# Patient Record
Sex: Male | Born: 1965
Health system: Southern US, Community
[De-identification: ages and names within clinical notes are randomized; demographics above are authoritative.]

## PROBLEM LIST (undated history)

## (undated) DIAGNOSIS — E785 Hyperlipidemia, unspecified: Secondary | ICD-10-CM

## (undated) DIAGNOSIS — E119 Type 2 diabetes mellitus without complications: Secondary | ICD-10-CM

## (undated) HISTORY — DX: Hyperlipidemia, unspecified: E78.5

## (undated) HISTORY — DX: Type 2 diabetes mellitus without complications: E11.9

---

## 2001-02-01 ENCOUNTER — Encounter: Admission: RE | Admit: 2001-02-01 | Discharge: 2001-02-01 | Payer: Self-pay | Admitting: Emergency Medicine

## 2001-02-01 ENCOUNTER — Encounter: Payer: Self-pay | Admitting: Emergency Medicine

## 2002-05-03 ENCOUNTER — Encounter: Payer: Self-pay | Admitting: Emergency Medicine

## 2002-05-03 ENCOUNTER — Encounter: Admission: RE | Admit: 2002-05-03 | Discharge: 2002-05-03 | Payer: Self-pay | Admitting: Emergency Medicine

## 2002-11-14 ENCOUNTER — Encounter: Admission: RE | Admit: 2002-11-14 | Discharge: 2002-11-14 | Payer: Self-pay | Admitting: Emergency Medicine

## 2002-11-14 ENCOUNTER — Encounter: Payer: Self-pay | Admitting: Emergency Medicine

## 2003-11-14 ENCOUNTER — Encounter: Admission: RE | Admit: 2003-11-14 | Discharge: 2003-11-14 | Payer: Self-pay | Admitting: Emergency Medicine

## 2017-09-14 DIAGNOSIS — Z0000 Encounter for general adult medical examination without abnormal findings: Secondary | ICD-10-CM | POA: Diagnosis not present

## 2017-09-14 DIAGNOSIS — R7309 Other abnormal glucose: Secondary | ICD-10-CM | POA: Diagnosis not present

## 2017-09-14 DIAGNOSIS — Z125 Encounter for screening for malignant neoplasm of prostate: Secondary | ICD-10-CM | POA: Diagnosis not present

## 2017-09-21 DIAGNOSIS — Z0000 Encounter for general adult medical examination without abnormal findings: Secondary | ICD-10-CM | POA: Diagnosis not present

## 2017-09-21 DIAGNOSIS — Z23 Encounter for immunization: Secondary | ICD-10-CM | POA: Diagnosis not present

## 2017-09-21 DIAGNOSIS — Z1389 Encounter for screening for other disorder: Secondary | ICD-10-CM | POA: Diagnosis not present

## 2017-09-21 DIAGNOSIS — R7309 Other abnormal glucose: Secondary | ICD-10-CM | POA: Diagnosis not present

## 2017-09-21 DIAGNOSIS — M545 Low back pain: Secondary | ICD-10-CM | POA: Diagnosis not present

## 2017-09-21 DIAGNOSIS — K219 Gastro-esophageal reflux disease without esophagitis: Secondary | ICD-10-CM | POA: Diagnosis not present

## 2017-09-21 DIAGNOSIS — J302 Other seasonal allergic rhinitis: Secondary | ICD-10-CM | POA: Diagnosis not present

## 2017-09-21 DIAGNOSIS — E781 Pure hyperglyceridemia: Secondary | ICD-10-CM | POA: Diagnosis not present

## 2017-09-28 DIAGNOSIS — M5416 Radiculopathy, lumbar region: Secondary | ICD-10-CM | POA: Diagnosis not present

## 2017-09-28 DIAGNOSIS — Z1212 Encounter for screening for malignant neoplasm of rectum: Secondary | ICD-10-CM | POA: Diagnosis not present

## 2017-10-06 DIAGNOSIS — M545 Low back pain: Secondary | ICD-10-CM | POA: Diagnosis not present

## 2017-10-20 DIAGNOSIS — M5136 Other intervertebral disc degeneration, lumbar region: Secondary | ICD-10-CM | POA: Diagnosis not present

## 2017-10-20 DIAGNOSIS — M51369 Other intervertebral disc degeneration, lumbar region without mention of lumbar back pain or lower extremity pain: Secondary | ICD-10-CM | POA: Insufficient documentation

## 2017-11-02 DIAGNOSIS — M5416 Radiculopathy, lumbar region: Secondary | ICD-10-CM | POA: Diagnosis not present

## 2018-08-24 DIAGNOSIS — M5136 Other intervertebral disc degeneration, lumbar region: Secondary | ICD-10-CM | POA: Diagnosis not present

## 2018-08-24 DIAGNOSIS — M5126 Other intervertebral disc displacement, lumbar region: Secondary | ICD-10-CM | POA: Diagnosis not present

## 2018-09-09 DIAGNOSIS — M5136 Other intervertebral disc degeneration, lumbar region: Secondary | ICD-10-CM | POA: Diagnosis not present

## 2018-09-20 DIAGNOSIS — Z125 Encounter for screening for malignant neoplasm of prostate: Secondary | ICD-10-CM | POA: Diagnosis not present

## 2018-09-20 DIAGNOSIS — E781 Pure hyperglyceridemia: Secondary | ICD-10-CM | POA: Diagnosis not present

## 2018-09-20 DIAGNOSIS — Z0000 Encounter for general adult medical examination without abnormal findings: Secondary | ICD-10-CM | POA: Diagnosis not present

## 2018-09-20 DIAGNOSIS — R739 Hyperglycemia, unspecified: Secondary | ICD-10-CM | POA: Diagnosis not present

## 2018-09-23 DIAGNOSIS — R82998 Other abnormal findings in urine: Secondary | ICD-10-CM | POA: Diagnosis not present

## 2018-09-27 DIAGNOSIS — R7301 Impaired fasting glucose: Secondary | ICD-10-CM | POA: Diagnosis not present

## 2018-09-27 DIAGNOSIS — K219 Gastro-esophageal reflux disease without esophagitis: Secondary | ICD-10-CM | POA: Diagnosis not present

## 2018-09-27 DIAGNOSIS — Z0000 Encounter for general adult medical examination without abnormal findings: Secondary | ICD-10-CM | POA: Diagnosis not present

## 2018-09-27 DIAGNOSIS — E781 Pure hyperglyceridemia: Secondary | ICD-10-CM | POA: Diagnosis not present

## 2018-09-27 DIAGNOSIS — Z1331 Encounter for screening for depression: Secondary | ICD-10-CM | POA: Diagnosis not present

## 2018-09-27 DIAGNOSIS — J302 Other seasonal allergic rhinitis: Secondary | ICD-10-CM | POA: Diagnosis not present

## 2018-10-11 DIAGNOSIS — M48061 Spinal stenosis, lumbar region without neurogenic claudication: Secondary | ICD-10-CM | POA: Insufficient documentation

## 2018-10-11 DIAGNOSIS — M5126 Other intervertebral disc displacement, lumbar region: Secondary | ICD-10-CM | POA: Diagnosis not present

## 2018-10-11 DIAGNOSIS — M5136 Other intervertebral disc degeneration, lumbar region: Secondary | ICD-10-CM | POA: Diagnosis not present

## 2018-10-19 DIAGNOSIS — M545 Low back pain: Secondary | ICD-10-CM | POA: Diagnosis not present

## 2018-10-27 DIAGNOSIS — M545 Low back pain: Secondary | ICD-10-CM | POA: Diagnosis not present

## 2018-11-02 DIAGNOSIS — M5416 Radiculopathy, lumbar region: Secondary | ICD-10-CM | POA: Diagnosis not present

## 2018-11-02 DIAGNOSIS — M5136 Other intervertebral disc degeneration, lumbar region: Secondary | ICD-10-CM | POA: Diagnosis not present

## 2018-11-25 DIAGNOSIS — M5126 Other intervertebral disc displacement, lumbar region: Secondary | ICD-10-CM | POA: Diagnosis not present

## 2018-12-13 DIAGNOSIS — M5136 Other intervertebral disc degeneration, lumbar region: Secondary | ICD-10-CM | POA: Diagnosis not present

## 2018-12-23 DIAGNOSIS — Z79899 Other long term (current) drug therapy: Secondary | ICD-10-CM | POA: Diagnosis not present

## 2018-12-23 DIAGNOSIS — M5416 Radiculopathy, lumbar region: Secondary | ICD-10-CM | POA: Diagnosis not present

## 2018-12-23 DIAGNOSIS — M48062 Spinal stenosis, lumbar region with neurogenic claudication: Secondary | ICD-10-CM | POA: Diagnosis not present

## 2019-01-04 ENCOUNTER — Ambulatory Visit: Payer: BC Managed Care – PPO | Attending: Nurse Practitioner | Admitting: Physical Therapy

## 2019-01-04 ENCOUNTER — Other Ambulatory Visit: Payer: Self-pay

## 2019-01-04 ENCOUNTER — Encounter: Payer: Self-pay | Admitting: Physical Therapy

## 2019-01-04 DIAGNOSIS — G8929 Other chronic pain: Secondary | ICD-10-CM | POA: Insufficient documentation

## 2019-01-04 DIAGNOSIS — M62830 Muscle spasm of back: Secondary | ICD-10-CM | POA: Insufficient documentation

## 2019-01-04 DIAGNOSIS — R2689 Other abnormalities of gait and mobility: Secondary | ICD-10-CM | POA: Insufficient documentation

## 2019-01-04 DIAGNOSIS — M545 Low back pain: Secondary | ICD-10-CM | POA: Insufficient documentation

## 2019-01-05 ENCOUNTER — Encounter: Payer: Self-pay | Admitting: Physical Therapy

## 2019-01-05 NOTE — Therapy (Signed)
[facility] [address] [place], [place], [address] Phone: [phone]   Fax:  [phone]  Physical Therapy Evaluation  Patient Details  Name: Scott Robinson MRN: [id number] Date of Birth: 01/19/1966 Referring Provider (PT): Carmel Sacramentoerry Tyler NP    Encounter Date: 01/04/2019  PT End of Session - 01/05/19 0919    Visit Number  1    Number of Visits  12    Date for PT Re-Evaluation  02/16/19    Authorization Type  BCBS    PT Start Time  1415    PT Stop Time  1458    PT Time Calculation (min)  43 min    Activity Tolerance  Patient tolerated treatment well    Behavior During Therapy  [facility] for tasks assessed/performed       History reviewed. No pertinent past medical history.  History reviewed. No pertinent surgical history.  There were no vitals filed for this visit.   Subjective Assessment - 01/04/19 1417    Subjective  Patient has had low back pain for several years but over the past 2 months the pain has increased. 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 He has been out of work for two months. He feels like when he is walking that he can not walk straight. He feels pullking in the frot of his thighs when he is standing.    How long can you stand comfortably?  < 1 hour    How long can you walk comfortably?  limited distances with pain    Diagnostic tests  MRI: per patient showed stenosis    Patient Stated Goals  to have less pain    Currently in Pain?  Yes    Pain Score  7     Pain Location  Back    Pain Orientation  Right;Left   R > L   Pain Descriptors / Indicators  Aching    Pain Type  Chronic pain    Pain Onset  More than a month ago    Pain Frequency  Intermittent    Aggravating Factors   Standing and walking    Pain Relieving Factors  sittng    Effect of Pain on Daily Activities  unable to workj         [facility] 01/05/19 0001      Assessment   Medical Diagnosis  Low Back Pain      Referring Provider (PT)  Carmel Sacramentoerry Tyler NP     Onset Date/Surgical Date  --   increased pain over the past 2 months    Hand Dominance  Right    Next MD Visit  None    Prior Therapy  None       Precautions   Precautions  None      Restrictions   Weight Bearing Restrictions  No      Balance Screen   Has the patient fallen in the past 6 months  No    Has the patient had a decrease in activity level because of a fear of falling?   No    Is the patient reluctant to leave their home because of a fear of falling?   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 Feels some numbness in the morning       Coordination   Gross Motor Movements are Fluid and Coordinated  Yes    Fine Motor Movements are Fluid and Coordinated  Yes      Posture/Postural Control   Posture Comments  Stands with lumbar flexion; otherwise good posture       AROM   Lumbar Flexion  46 with increased pain     Lumbar Extension  5 degrees with a significant increase in pain     Lumbar - Right Side Bend  increased pain     Lumbar - Left Side Bend  increased pain     Lumbar - Right Rotation  increased pain 50% limitated     Lumbar - Left Rotation  increased pain 50% limited       PROM   Overall PROM Comments  mild pain with internal rotation of the right leg       Strength   Overall  Strength Comments  knee 5/5     Strength Assessment Site  Hip    Right/Left Hip  Right;Left    Right Hip Flexion  4/5    Right Hip ABduction  4/5    Left Hip Flexion  4/5    Left Hip ABduction  4/5      Palpation   Palpation comment  significant spasming in bilateral lumbar paraspinals but left is greather then the right; spasming into bilateral gluteals       Special Tests   Other special tests  SLR increased pain bilateral but no radicular signs       Ambulation/Gait   Gait Comments  decreased bilateral hip rotation; decreased hip flexion                 Objective measurements completed on examination: See above findings.      [facility] Adult PT Treatment/Exercise - 01/05/19 0001      Lumbar Exercises: Stretches   Lower Trunk Rotation Limitations  x10     Piriformis Stretch  3 reps;20 seconds;Right;Left    Other Lumbar Stretch Exercise  left lateral shift corrected to the right    Other Lumbar Stretch Exercise  tennis ball trigger point release       Manual Therapy   Manual Therapy  Soft tissue mobilization;Manual Traction    Soft tissue mobilization  IASTYM to bilateral lumbar paraspinals     Manual Traction  bilateral LAD with Grade II and II oscilations              PT Education - 01/05/19 0919    Education Details  HEP and symptom mangement    Person(s) Educated  Patient    Methods  Explanation;Demonstration;Tactile cues;Verbal cues    Comprehension  Verbalized understanding;Returned demonstration;Verbal cues required;Tactile cues required       PT  Short Term Goals - 01/05/19 0928      PT SHORT TERM GOAL #1   Title  Patient will be indpendent with basic strengthening and stretching program    Time  3    Period  Weeks    Status  New    Target Date  01/26/19      PT SHORT TERM GOAL #2   Title  Patient will increase gorss bilateral hip strength to 4+/5    Time  3    Period  Weeks    Status  New    Target Date  01/26/19      PT SHORT TERM  GOAL #3   Title  Patient will increase lumbar flexion by 20 degrees and extension by 10 degrees    Time  3    Period  Weeks    Status  New    Target Date  01/26/19        PT Long Term Goals - 01/05/19 0929      PT LONG TERM GOAL #1   Title  Patient will stand for 1 hour wouout pain    Time  6    Period  Weeks    Status  New    Target Date  02/16/19      PT LONG TERM GOAL #2   Title  Patient will pick 10lb obhject off the floor without pain    Time  6    Period  Weeks    Status  New    Target Date  02/16/19             Plan - 01/04/19 1614    Clinical Impression Statement  Patient is a 53 year old male with lower back pain that is effecting his ability to walk and work. He has a significant lateral shift to the left away from the right. He has increased pain with all lumbar movement and decreased strength in his hip flexors and abductors. He has significant ,muscle spasms in bilateral paraspinals. He would benefit from skilled therapy to reduce spasming and improve lumbar mobility. He would also benefit from lift training in order to return to work.    Personal Factors and Comorbidities  Profession    Examination-Activity Limitations  Squat;Lift;Locomotion Level    Examination-Participation Restrictions  Laundry;Yard Work;Community Activity    Stability/Clinical Decision Making  Evolving/Moderate complexity    Clinical Decision Making  Moderate    Rehab Potential  Good    PT Frequency  2x / week    PT Duration  6 weeks    PT Treatment/Interventions  ADLs/Self Care Home Management;Cryotherapy;Electrical Stimulation;Ultrasound;Moist Heat;DME Instruction;Functional mobility training;Therapeutic activities;Therapeutic exercise;Neuromuscular re-education;Patient/family education;Manual techniques;Passive range of motion;Dry needling;Taping    PT Next Visit Plan  He has no directional preference right now because he is inflammed with all movements. Continue correcting lateral  shift; dry needling to paraspinls and gluteals; continue with LAD; begin light core strengthening; progress to lifting when able but will likley be a few visits    PT Home Exercise Plan  tennis ball release; piriformis stretch; LTR; lateral shift correction for home    Consulted and Agree with Plan of Care  Patient       Patient will benefit from skilled therapeutic intervention in order to improve the following deficits and impairments:  Abnormal gait, Increased muscle spasms, Decreased range of motion, Decreased safety awareness, Decreased strength, Pain, Postural dysfunction, Increased fascial restricitons, Decreased activity tolerance  Visit Diagnosis: Chronic bilateral low back  pain without sciatica  Muscle spasm of back  Other abnormalities of gait and mobility     Problem List There are no active problems to display for this patient.   Dessie Coma PT DPT  01/05/2019, 12:31 PM  [facility] [address] [place], [place], [address] Phone: [phone]   Fax:  [phone]  Name: Scott Robinson MRN: [id number] Date of Birth: 08-08-1965

## 2019-01-06 DIAGNOSIS — M5416 Radiculopathy, lumbar region: Secondary | ICD-10-CM | POA: Diagnosis not present

## 2019-01-06 DIAGNOSIS — M48062 Spinal stenosis, lumbar region with neurogenic claudication: Secondary | ICD-10-CM | POA: Diagnosis not present

## 2019-01-06 DIAGNOSIS — Z79899 Other long term (current) drug therapy: Secondary | ICD-10-CM | POA: Diagnosis not present

## 2019-01-10 ENCOUNTER — Ambulatory Visit: Payer: BC Managed Care – PPO | Admitting: Physical Therapy

## 2019-01-10 ENCOUNTER — Encounter: Payer: Self-pay | Admitting: Physical Therapy

## 2019-01-10 ENCOUNTER — Other Ambulatory Visit: Payer: Self-pay

## 2019-01-10 DIAGNOSIS — M62830 Muscle spasm of back: Secondary | ICD-10-CM | POA: Diagnosis not present

## 2019-01-10 DIAGNOSIS — R2689 Other abnormalities of gait and mobility: Secondary | ICD-10-CM | POA: Diagnosis not present

## 2019-01-10 DIAGNOSIS — M545 Low back pain, unspecified: Secondary | ICD-10-CM

## 2019-01-10 DIAGNOSIS — G8929 Other chronic pain: Secondary | ICD-10-CM | POA: Diagnosis not present

## 2019-01-10 NOTE — Patient Instructions (Signed)

## 2019-01-10 NOTE — Therapy (Signed)
[facility] [address] [place], [place], [address] Phone: [phone]   Fax:  [phone]  Physical Therapy Treatment  Patient Details  Name: Scott Robinson MRN: [id number] Date of Birth: 10-16-1965 Referring Provider (PT): Carmel Sacramento NP    Encounter Date: 01/10/2019  PT End of Session - 01/10/19 1723    Visit Number  2    Number of Visits  12    Date for PT Re-Evaluation  02/16/19    Authorization Type  BCBS    PT Start Time  1630    PT Stop Time  1713    PT Time Calculation (min)  43 min    Activity Tolerance  Patient tolerated treatment well    Behavior During Therapy  [facility] for tasks assessed/performed       History reviewed. No pertinent past medical history.  History reviewed. No pertinent surgical history.  There were no vitals filed for this visit.  Subjective Assessment - 01/10/19 1633    Subjective  Pt. returns for first follow up visit since eval    Currently in Pain?  Yes    Pain Score  7     Pain Location  Back    Pain Orientation  Right;Left   right>left   Pain Descriptors / Indicators  Aching    Pain Type  Chronic pain    Pain Onset  More than a month ago    Pain Frequency  Intermittent    Aggravating Factors   standing and walking    Pain Relieving Factors  sitting    Effect of Pain on Daily Activities  unable to work                       [facility] Adult PT Treatment/Exercise - 01/10/19 0001      Lumbar Exercises: Stretches   Lower Trunk Rotation Limitations  x10     Pelvic Tilt  15 reps    Other Lumbar Stretch Exercise  left lateral shift correct at wall (toward right) x10 independently and x 2x10 with manual assistance    Other Lumbar Stretch Exercise  sidelying manual right QL stretch 20 sec x 3      Manual Therapy   Soft tissue mobilization  STM bilat. lumbar paraspinals in prone over pillow    Manual Traction  bilateral LAD with Grade II and II oscilations         Trigger Point Dry Needling - 01/10/19 0001    Consent Given?  Yes    Education Handout Provided  Yes    Muscles Treated Back/Hip  Gluteus maximus;Erector spinae;Lumbar multifidi    Dry Needling Comments  needling in prone over pillow with 60 mm 32 gauge needles, paraspinals needled at L4-S1 region bilaterally    Electrical Stimulation Performed with Dry Needling  Yes    E-stim with Dry Needling Details  TENS 2 pps x 10 minutes           PT Education - 01/10/19 1722    Education Details  exercises, dry needling, POC    Person(s) Educated  Patient    Methods  Explanation;Demonstration;Verbal cues;Tactile cues;Handout    Comprehension  Verbalized understanding;Returned demonstration       PT Short Term Goals - 01/05/19 1700      PT SHORT TERM GOAL #1   Title  Patient will be indpendent with basic strengthening and stretching program    Time  3    Period  Weeks  Status  New    Target Date  01/26/19      PT SHORT TERM GOAL #2   Title  Patient will increase gorss bilateral hip strength to 4+/5    Time  3    Period  Weeks    Status  New    Target Date  01/26/19      PT SHORT TERM GOAL #3   Title  Patient will increase lumbar flexion by 20 degrees and extension by 10 degrees    Time  3    Period  Weeks    Status  New    Target Date  01/26/19        PT Long Term Goals - 01/05/19 0929      PT LONG TERM GOAL #1   Title  Patient will stand for 1 hour wouout pain    Time  6    Period  Weeks    Status  New    Target Date  02/16/19      PT LONG TERM GOAL #2   Title  Patient will pick 10lb obhject off the floor without pain    Time  6    Period  Weeks    Status  New    Target Date  02/16/19            Plan - 01/10/19 1727    Clinical Impression Statement  Pt. continues with left lateral shift so continued work on correcting this (mild improvement and reported decreased pain at end of session but still with shift post-tx.). Diffuse tightness lumbar  paraspinals bilat. so included focus STM and trial dry needling to address. Some expected soreness with lateral shift correction, reviewed form for HEP and pt. able to return demos correctly. Need for PT will be ongoing to address mechanical derangement and progress functional abilities as tolerated.    Personal Factors and Comorbidities  Profession    Examination-Activity Limitations  Squat;Lift;Locomotion Level    Examination-Participation Restrictions  Laundry;Yard Work;Community Activity    Stability/Clinical Decision Making  Evolving/Moderate complexity    Clinical Decision Making  Moderate    Rehab Potential  Good    PT Frequency  2x / week    PT Duration  6 weeks    PT Treatment/Interventions  ADLs/Self Care Home Management;Cryotherapy;Electrical Stimulation;Ultrasound;Moist Heat;DME Instruction;Functional mobility training;Therapeutic activities;Therapeutic exercise;Neuromuscular re-education;Patient/family education;Manual techniques;Passive range of motion;Dry needling;Taping    PT Next Visit Plan  continue lateral shift correction, LAD, STM, further dry needling pending response to today's session, light core work, stretches, progress to addressing lifting mechanics as appropriate pending progress in the next several visits    PT Home Exercise Plan  tennis ball release; piriformis stretch; LTR; lateral shift correction for home    Consulted and Agree with Plan of Care  Patient       Patient will benefit from skilled therapeutic intervention in order to improve the following deficits and impairments:  Abnormal gait, Increased muscle spasms, Decreased range of motion, Decreased safety awareness, Decreased strength, Pain, Postural dysfunction, Increased fascial restricitons, Decreased activity tolerance  Visit Diagnosis: Chronic bilateral low back pain without sciatica  Muscle spasm of back  Other abnormalities of gait and mobility     Problem List There are no active problems to  display for this patient.   Beaulah Dinning, PT, DPT 01/10/19 5:52 PM  [facility] [address] [place], [place], [address] Phone: [phone]   Fax:  [phone]  Name: Treson Laura  MRN: [id number] Date of Birth: 11/05/1965

## 2019-01-13 ENCOUNTER — Other Ambulatory Visit: Payer: Self-pay

## 2019-01-13 ENCOUNTER — Encounter: Payer: Self-pay | Admitting: Physical Therapy

## 2019-01-13 ENCOUNTER — Ambulatory Visit: Payer: BC Managed Care – PPO | Admitting: Physical Therapy

## 2019-01-13 DIAGNOSIS — R2689 Other abnormalities of gait and mobility: Secondary | ICD-10-CM

## 2019-01-13 DIAGNOSIS — G8929 Other chronic pain: Secondary | ICD-10-CM

## 2019-01-13 DIAGNOSIS — M545 Low back pain: Secondary | ICD-10-CM | POA: Diagnosis not present

## 2019-01-13 DIAGNOSIS — M62830 Muscle spasm of back: Secondary | ICD-10-CM | POA: Diagnosis not present

## 2019-01-13 NOTE — Therapy (Signed)
[facility] [address] [place], [place], [address] Phone: [phone]   Fax:  [phone]  Physical Therapy Treatment  Patient Details  Name: Scott Robinson MRN: [id number] Date of Birth: February 01, 1966 Referring Provider (PT): Carmel Sacramento NP    Encounter Date: 01/13/2019  PT End of Session - 01/13/19 1709    Visit Number  3    Number of Visits  12    Date for PT Re-Evaluation  02/16/19    Authorization Type  BCBS    PT Start Time  1546    PT Stop Time  1630    PT Time Calculation (min)  44 min    Activity Tolerance  Patient tolerated treatment well    Behavior During Therapy  [facility] for tasks assessed/performed       History reviewed. No pertinent past medical history.  History reviewed. No pertinent surgical history.  There were no vitals filed for this visit.  Subjective Assessment - 01/13/19 1607    Subjective  "It's a little bit better" (after last visit). Today pt. reports right-sided LBP 6/10 with intermittent radiating into right thigh distal to knee with walking.    Currently in Pain?  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 lumbar paraspinals in prone over pillow    Manual Traction  bilateral LAD with Grade II-III oscilations        Trigger Point Dry Needling - 01/13/19 0001    Consent Given?  Yes    Muscles Treated Back/Hip  Erector spinae;Lumbar multifidi    Dry Needling Comments  L4-5 level bilat. in prone with 32 gaue 60 mm needles    Electrical Stimulation Performed with Dry Needling  Yes    E-stim with Dry Needling Details  TENS 2 pps x 10 minutes           PT Education - 01/13/19 1706    Education Details  Exercises, HEP, spinal anatomy with stenosis    Person(s) Educated  Patient    Methods  Explanation;Demonstration;Tactile cues;Verbal cues;Handout    Comprehension  Verbalized understanding;Returned demonstration       PT Short Term  Goals - 01/05/19 0928      PT SHORT TERM GOAL #1   Title  Patient will be indpendent with basic strengthening and stretching program    Time  3    Period  Weeks    Status  New    Target Date  01/26/19      PT SHORT TERM GOAL #2   Title  Patient will increase gorss bilateral hip strength to 4+/5    Time  3    Period  Weeks    Status  New    Target Date  01/26/19      PT SHORT TERM GOAL #3   Title  Patient will increase lumbar flexion by 20 degrees and extension by 10 degrees    Time  3    Period  Weeks    Status  New    Target Date  01/26/19        PT Long Term Goals - 01/05/19 0929      PT LONG TERM GOAL #1   Title  Patient will stand for 1 hour wouout pain    Time  6    Period  Weeks    Status  New    Target Date  02/16/19      PT LONG TERM GOAL #2   Title  Patient will pick 10lb obhject off the floor without pain    Time  6    Period  Weeks    Status  New    Target Date  02/16/19            Plan - 01/13/19 1711    Clinical Impression Statement  Mild improvement  from status at last session. Pt. still with left lateral shift but this was less pronounced today than last visit. Added light core strengthening as well as some flexion bias ROM given underlying stenosis and pain with extension. Trunk ROM still limited but flexion improved from baseline as well. Pt. would benefit from continued therapy for further progress to address current functional limitations.    Personal Factors and Comorbidities  Profession    Examination-Activity Limitations  Squat;Lift;Locomotion Level    Stability/Clinical Decision Making  Evolving/Moderate complexity    Clinical Decision Making  Moderate    Rehab Potential  Good    PT Frequency  2x / week    PT Duration  6 weeks    PT Treatment/Interventions  ADLs/Self Care Home Management;Cryotherapy;Electrical Stimulation;Ultrasound;Moist Heat;DME Instruction;Functional mobility training;Therapeutic activities;Therapeutic exercise;Neuromuscular re-education;Patient/family education;Manual techniques;Passive range of motion;Dry needling;Taping    PT Next Visit Plan  continue lateral shift correction, LAD, STM, further dry needling prn, light core work, stretches, progress to addressing lifting mechanics as appropriate pending progress in the next several visits    PT Home Exercise Plan  tennis ball release; piriformis stretch; LTR; lateral shift correction for home, pelvic tltis, SKTC, hip bridge, supine marches    Consulted and Agree with Plan of Care  Patient       Patient will benefit from skilled therapeutic intervention in order to improve the following deficits and impairments:  Abnormal gait, Increased muscle spasms, Decreased range of motion, Decreased safety awareness, Decreased strength, Pain, Postural dysfunction, Increased fascial restricitons, Decreased activity tolerance  Visit Diagnosis: Chronic bilateral low back pain without sciatica  Muscle spasm of back  Other abnormalities of gait and mobility     Problem  List There are no active problems to display for this patient.   Beaulah Dinning, PT, DPT 01/13/19 5:14  PM  [facility] [address] [place], [place], [address] Phone: [phone]   Fax:  [phone]  Name: Jerral BonitoSomkhuan Dung MRN: [id number] Date of Birth: 06/26/1965

## 2019-01-17 ENCOUNTER — Ambulatory Visit: Payer: BC Managed Care – PPO | Admitting: Physical Therapy

## 2019-01-18 DIAGNOSIS — Z1159 Encounter for screening for other viral diseases: Secondary | ICD-10-CM | POA: Diagnosis not present

## 2019-01-18 DIAGNOSIS — M5416 Radiculopathy, lumbar region: Secondary | ICD-10-CM | POA: Diagnosis not present

## 2019-01-18 DIAGNOSIS — M48062 Spinal stenosis, lumbar region with neurogenic claudication: Secondary | ICD-10-CM | POA: Diagnosis not present

## 2019-01-19 ENCOUNTER — Encounter: Payer: Self-pay | Admitting: Physical Therapy

## 2019-01-19 ENCOUNTER — Other Ambulatory Visit: Payer: Self-pay

## 2019-01-19 ENCOUNTER — Ambulatory Visit: Payer: BC Managed Care – PPO | Admitting: Physical Therapy

## 2019-01-19 DIAGNOSIS — M62830 Muscle spasm of back: Secondary | ICD-10-CM

## 2019-01-19 DIAGNOSIS — M545 Low back pain: Secondary | ICD-10-CM | POA: Diagnosis not present

## 2019-01-19 DIAGNOSIS — R2689 Other abnormalities of gait and mobility: Secondary | ICD-10-CM | POA: Diagnosis not present

## 2019-01-19 DIAGNOSIS — G8929 Other chronic pain: Secondary | ICD-10-CM | POA: Diagnosis not present

## 2019-01-20 NOTE — Therapy (Signed)
[facility] [address] [place], [place], [address] Phone: [phone]   Fax:  [phone]  Physical Therapy Treatment  Patient Details  Name: Damondre Pfeifle MRN: [id number] Date of Birth: 08-08-1965 Referring Provider (PT): Carmel Sacramento NP    Encounter Date: 01/19/2019  PT End of Session - 01/19/19 1540    Visit Number  4    Number of Visits  12    Date for PT Re-Evaluation  02/16/19    Authorization Type  BCBS    PT Start Time  1500    PT Stop Time  1543    PT Time Calculation (min)  43 min    Activity Tolerance  Patient tolerated treatment well    Behavior During Therapy  [facility] for tasks assessed/performed       History reviewed. No pertinent past medical history.  History reviewed. No pertinent surgical history.  There were no vitals filed for this visit.  Subjective Assessment - 01/19/19 1505    Subjective  Patient reports it continues to get alittle better but it is hurting today. He feels like the needling is helping. He has also been using his stretches. He has a significant lateral shift still.    How long can you walk comfortably?  limited distances with pain    Diagnostic tests  MRI: per patient showed stenosis    Patient Stated Goals  to have less pain    Currently in Pain?  Yes    Pain Score  7     Pain Location  Back    Pain Orientation  Right    Pain Descriptors / Indicators  Aching    Pain Type  Chronic pain    Pain Onset  More than a month ago    Pain Frequency  Intermittent    Aggravating Factors   standing and walking    Pain Relieving Factors  sitting    Effect of Pain on Daily Activities  unable to work                       [facility] Adult PT Treatment/Exercise - 01/20/19 0001      Lumbar Exercises: Stretches   Pelvic Tilt  10 reps    Piriformis Stretch  Right;Left;2 reps;30 seconds    Other Lumbar Stretch Exercise  belt assisted manual shift correction 5x 10 sec holds.  Improoved with minimal incrrease in pain       Manual Therapy   Manual Therapy  Joint mobilization    Joint Mobilization  Garde II and III PA glides to L2, L3 , L4     Soft tissue mobilization  IASTMY to bilateral paraspinals;     Manual Traction  bilateral LAD with Grade II-III oscilations        Trigger Point Dry Needling - 01/20/19 0001    Consent Given?  Yes    Education Handout Provided  Yes    Muscles Treated Back/Hip  Lumbar multifidi    Dry Needling Comments  L4 L5 left     Lumbar multifidi Response  Twitch response elicited           PT Education - 01/19/19 1508    Education Details  reviewed HEP and symptom management    Person(s) Educated  Patient    Methods  Explanation;Demonstration;Tactile cues;Verbal cues    Comprehension  Verbalized understanding;Verbal cues required;Returned demonstration;Tactile cues required       PT Short Term Goals - 01/20/19 1453  PT SHORT TERM GOAL #1   Title  Patient will be indpendent with basic strengthening and stretching program    Baseline  working on basic stretvhing and strengthening.    Time  3    Period  Weeks    Status  On-going      PT SHORT TERM GOAL #2   Title  Patient will increase gorss bilateral hip strength to 4+/5    Baseline  not tested    Time  3    Period  Weeks    Status  New    Target Date  01/26/19      PT SHORT TERM GOAL #3   Title  Patient will increase lumbar flexion by 20 degrees and extension by 10 degrees    Time  3    Period  Weeks    Status  On-going    Target Date  01/26/19        PT Long Term Goals - 01/05/19 0929      PT LONG TERM GOAL #1   Title  Patient will stand for 1 hour wouout pain    Time  6    Period  Weeks    Status  New    Target Date  02/16/19      PT LONG TERM GOAL #2   Title  Patient will pick 10lb obhject off the floor without pain    Time  6    Period  Weeks    Status  New    Target Date  02/16/19            Plan - 01/20/19 1448    Clinical  Impression Statement  Patient demonstrated improvd lateral shift with treatment. He also reported a mild improvement in pain. His muscle spasming in his quadratus and right parspinals has decreased signifcanlty since the intial evaluation. He continues to have limited mobility of his lumbar spine. He had tenderness to palpation of his left parspinal today. He was needled in his left multifidus. Patient worked on light core stabilzation today. Therapy will continue to adb=vance core stabilzation as tolerated.    Personal Factors and Comorbidities  Profession    Examination-Activity Limitations  Squat;Lift;Locomotion Level    Examination-Participation Restrictions  Laundry;Yard Work;Community Activity    Stability/Clinical Decision Making  Evolving/Moderate complexity    Clinical Decision Making  Moderate    Rehab Potential  Good    PT Frequency  2x / week    PT Duration  6 weeks    PT Treatment/Interventions  ADLs/Self Care Home Management;Cryotherapy;Electrical Stimulation;Ultrasound;Moist Heat;DME Instruction;Functional mobility training;Therapeutic activities;Therapeutic exercise;Neuromuscular re-education;Patient/family education;Manual techniques;Passive range of motion;Dry needling;Taping    PT Next Visit Plan  continue lateral shift correction, LAD, STM, further dry needling prn, light core work, stretches, progress to addressing lifting mechanics as appropriate pending progress in the next several visits    PT Home Exercise Plan  tennis ball release; piriformis stretch; LTR; lateral shift correction for home, pelvic tltis, SKTC, hip bridge, supine marches       Patient will benefit from skilled therapeutic intervention in order to improve the following deficits and impairments:  Abnormal gait, Increased muscle spasms, Decreased range of motion, Decreased safety awareness, Decreased strength, Pain, Postural dysfunction, Increased fascial restricitons, Decreased activity tolerance  Visit  Diagnosis: Chronic bilateral low back pain without sciatica  Muscle spasm of back  Other abnormalities of gait and mobility     Problem List There are no active problems to display for this patient.  Scott Robinson  PT DPT  01/20/2019, 3:11 PM  [facility] [address] [place], [place], [address] Phone: [phone]   Fax:  [phone]  Name: Jerral BonitoSomkhuan Fredell MRN: [id number] Date of Birth: 04/24/1965

## 2019-01-21 DIAGNOSIS — Z209 Contact with and (suspected) exposure to unspecified communicable disease: Secondary | ICD-10-CM | POA: Diagnosis not present

## 2019-01-24 ENCOUNTER — Ambulatory Visit: Payer: BC Managed Care – PPO | Attending: Nurse Practitioner | Admitting: Physical Therapy

## 2019-01-24 ENCOUNTER — Other Ambulatory Visit: Payer: Self-pay

## 2019-01-24 ENCOUNTER — Encounter: Payer: Self-pay | Admitting: Physical Therapy

## 2019-01-24 DIAGNOSIS — M62830 Muscle spasm of back: Secondary | ICD-10-CM | POA: Diagnosis not present

## 2019-01-24 DIAGNOSIS — G8929 Other chronic pain: Secondary | ICD-10-CM | POA: Insufficient documentation

## 2019-01-24 DIAGNOSIS — M545 Low back pain: Secondary | ICD-10-CM | POA: Diagnosis not present

## 2019-01-24 DIAGNOSIS — R2689 Other abnormalities of gait and mobility: Secondary | ICD-10-CM | POA: Diagnosis not present

## 2019-01-25 ENCOUNTER — Encounter: Payer: Self-pay | Admitting: Physical Therapy

## 2019-01-25 NOTE — Therapy (Signed)
[place], [place], [address] Phone: [phone]   Fax:  [phone]  Physical Therapy Treatment  Patient Details  Name: Kali Ambler MRN: [id number] Date of Birth: 10-21-1965 Referring Provider (PT): Emelia Loron NP    Encounter Date: 01/24/2019  PT End of Session - 01/25/19 0809    Visit Number  5    Number of Visits  12    Date for PT Re-Evaluation  02/16/19    Authorization Type  BCBS    PT Start Time  2979    PT Stop Time  1628    PT Time Calculation (min)  43 min    Activity Tolerance  Patient tolerated treatment well    Behavior During Therapy  [facility] for tasks assessed/performed       History reviewed. No pertinent past medical history.  History reviewed. No pertinent surgical history.  There were no vitals filed for this visit.  Subjective Assessment - 01/24/19 1553    Subjective  Patients reports he feels better after he goes home but within a few days his pain is about the same. He comes in today again with a significant lateral shift. His pain level is about the same.    How long can you stand comfortably?  < 1 hour    How long can you walk comfortably?  limited distances with pain    Diagnostic tests  MRI: per patient showed stenosis    Patient Stated Goals  to have less pain    Currently in Pain?  Yes    Pain Score  7     Pain Location  Back    Pain Orientation  Right    Pain Descriptors / Indicators  Aching    Pain Type  Chronic pain    Pain Onset  More than a month ago    Pain Frequency  Intermittent    Aggravating Factors   standing and walking    Pain Relieving Factors  sitting    Effect of Pain on Daily Activities  unable to work    Multiple Pain Sites  No                         Trigger Point Dry Needling - 01/25/19 0001    Consent Given?  Yes    Education Handout Provided  Yes    Muscles Treated Back/Hip  Lumbar multifidi    Dry Needling Comments   L4 L5 left     Lumbar multifidi Response  Twitch response elicited           PT Education - 01/24/19 1625    Education Details  reviewed ther-ex tocontinue with at home    Person(s) Educated  Patient    Methods  Explanation;Demonstration;Tactile cues;Verbal cues    Comprehension  Verbal cues required;Tactile cues required;Verbalized understanding;Returned demonstration       PT Short Term Goals - 01/20/19 1453      PT SHORT TERM GOAL #1   Title  Patient will be indpendent with basic strengthening and stretching program    Baseline  working on basic stretvhing and strengthening.    Time  3    Period  Weeks    Status  On-going      PT SHORT TERM GOAL #2   Title  Patient will increase gorss bilateral hip strength to 4+/5    Baseline  not tested    Time  3  Period  Weeks    Status  New    Target Date  01/26/19      PT SHORT TERM GOAL #3   Title  Patient will increase lumbar flexion by 20 degrees and extension by 10 degrees    Time  3    Period  Weeks    Status  On-going    Target Date  01/26/19        PT Long Term Goals - 01/05/19 0929      PT LONG TERM GOAL #1   Title  Patient will stand for 1 hour wouout pain    Time  6    Period  Weeks    Status  New    Target Date  02/16/19      PT LONG TERM GOAL #2   Title  Patient will pick 10lb obhject off the floor without pain    Time  6    Period  Weeks    Status  New    Target Date  02/16/19            Plan - 01/25/19 0810    Clinical Impression Statement  With activty the patients shift corrected. He reported better pain after treatment. He is having very little carryover after treatment. Therapy added side lying lumbar mobilization today.Therapy also gave him increased core strengthening today. He has not responed with much carryover to the manual therapy so we will shift to more of a core strengthening based program.    Personal Factors and Comorbidities  Profession    Examination-Activity Limitations   Squat;Lift;Locomotion Level    Examination-Participation Restrictions  Laundry;Yard Work;Community Activity    Stability/Clinical Decision Making  Evolving/Moderate complexity    Clinical Decision Making  Moderate    Rehab Potential  Good    PT Frequency  2x / week    PT Duration  6 weeks    PT Treatment/Interventions  ADLs/Self Care Home Management;Cryotherapy;Electrical Stimulation;Ultrasound;Moist Heat;DME Instruction;Functional mobility training;Therapeutic activities;Therapeutic exercise;Neuromuscular re-education;Patient/family education;Manual techniques;Passive range of motion;Dry needling;Taping    PT Next Visit Plan  continue lateral shift correction, LAD, STM, further dry needling prn, light core work, stretches, progress to addressing lifting mechanics as appropriate pending progress in the next several visits. consider continuing e-stim with needling.    PT Home Exercise Plan  tennis ball release; piriformis stretch; LTR; lateral shift correction for home, pelvic tltis, SKTC, hip bridge, supine marches       Patient will benefit from skilled therapeutic intervention in order to improve the following deficits and impairments:  Abnormal gait, Increased muscle spasms, Decreased range of motion, Decreased safety awareness, Decreased strength, Pain, Postural dysfunction, Increased fascial restricitons, Decreased activity tolerance  Visit Diagnosis: Chronic bilateral low back pain without sciatica  Muscle spasm of back  Other abnormalities of gait and mobility     Problem List There are no active problems to display for this patient.   Dessie Coma PT DPT  01/25/2019, 8:16 AM  [facility] [address] [place], [place], [address] Phone: [phone]   Fax:  [phone]  Name: Elvan Ebron MRN: [id number] Date of Birth: Jan 02, 1966

## 2019-01-26 ENCOUNTER — Other Ambulatory Visit: Payer: Self-pay

## 2019-01-26 ENCOUNTER — Encounter: Payer: Self-pay | Admitting: Physical Therapy

## 2019-01-26 ENCOUNTER — Ambulatory Visit: Payer: BC Managed Care – PPO | Admitting: Physical Therapy

## 2019-01-26 DIAGNOSIS — M545 Low back pain: Secondary | ICD-10-CM | POA: Diagnosis not present

## 2019-01-26 DIAGNOSIS — M62830 Muscle spasm of back: Secondary | ICD-10-CM

## 2019-01-26 DIAGNOSIS — G8929 Other chronic pain: Secondary | ICD-10-CM

## 2019-01-26 DIAGNOSIS — R2689 Other abnormalities of gait and mobility: Secondary | ICD-10-CM

## 2019-01-27 ENCOUNTER — Encounter: Payer: Self-pay | Admitting: Physical Therapy

## 2019-01-27 NOTE — Therapy (Signed)
[place], [place], [address] Phone: [phone]   Fax:  [phone]  Physical Therapy Treatment  Patient Details  Name: [place] MRN: [id number] Date of Birth: [birth year] Referring Provider (PT): Scott Loron NP    Encounter Date: 01/26/2019  PT End of Session - 01/27/19 0928    Visit Number  6    Number of Visits  12    Date for PT Re-Evaluation  02/16/19    Authorization Type  BCBS    PT Start Time  6967    PT Stop Time  1625    PT Time Calculation (min)  40 min    Activity Tolerance  Patient tolerated treatment well    Behavior During Therapy  [facility] for tasks assessed/performed       History reviewed. No pertinent past medical history.  History reviewed. No pertinent surgical history.  There were no vitals filed for this visit.  Subjective Assessment - 01/26/19 1553    Subjective  Patient continues to report a pain level of about a 7/10 but he reports it is more in the middle of his back. He has the most pain when he first stands.    How long can you stand comfortably?  < 1 hour    How long can you walk comfortably?  limited distances with pain    Diagnostic tests  MRI: per patient showed stenosis    Patient Stated Goals  to have less pain    Currently in Pain?  Yes    Pain Score  7     Pain Location  Back    Pain Orientation  Right    Pain Descriptors / Indicators  Aching    Pain Type  Chronic pain    Pain Onset  More than a month ago    Pain Frequency  Intermittent    Aggravating Factors   standing and walking    Pain Relieving Factors  sitting    Effect of Pain on Daily Activities  unable to work                       [facility] Adult PT Treatment/Exercise - 01/27/19 0001      Lumbar Exercises: Stretches   Piriformis Stretch  Right;Left;2 reps;30 seconds    Other Lumbar Stretch Exercise  belt assisted manual shift correction 5x 10 sec holds. Improoved with minimal  incrrease in pain       Lumbar Exercises: Standing   Other Standing Lumbar Exercises  scpa retraction 2x10 yellow; shoulder extension 2x10 red with cuing for abdominal brace       Lumbar Exercises: Supine   Clam Limitations  2x10 green with cuing for abdominal brace     Bridge Limitations  1/2 bridge 2x10     Pelvic Tilt  10 reps      Manual Therapy   Manual Therapy  Joint mobilization    Joint Mobilization  Garde II and III PA glides to L2, L3 , L4     Soft tissue mobilization  IASTMY to bilateral paraspinals;     Manual Traction  bilateral LAD with Grade II-III oscilations              PT Education - 01/27/19 0928    Education Details  HEP and symptom mangement    Person(s) Educated  Patient    Methods  Explanation;Demonstration;Tactile cues;Verbal cues    Comprehension  Verbalized understanding;Returned demonstration;Tactile cues required;Verbal cues  required       PT Short Term Goals - 01/27/19 0940      PT SHORT TERM GOAL #1   Title  Patient will be indpendent with basic strengthening and stretching program    Baseline  working on basic stretvhing and strengthening.    Time  3    Period  Weeks    Status  On-going    Target Date  01/26/19      PT SHORT TERM GOAL #2   Title  Patient will increase gorss bilateral hip strength to 4+/5    Baseline  not tested    Time  3    Period  Weeks    Status  On-going      PT SHORT TERM GOAL #3   Title  Patient will increase lumbar flexion by 20 degrees and extension by 10 degrees    Time  3    Period  Weeks    Status  On-going    Target Date  01/26/19        PT Long Term Goals - 01/05/19 0929      PT LONG TERM GOAL #1   Title  Patient will stand for 1 hour wouout pain    Time  6    Period  Weeks    Status  New    Target Date  02/16/19      PT LONG TERM GOAL #2   Title  Patient will pick 10lb obhject off the floor without pain    Time  6    Period  Weeks    Status  New    Target Date  02/16/19             Plan - 01/27/19 0936    Clinical Impression Statement  Patient's pain appears to be centrlizing. His lateral shift is less pronounced. He was able to tolerate ther-ex today. He is mostly having pain when he transfers from sit to stand. He had two trigger points inhis lateral gluteals todsay. He was advised to use the tennis ball in those areas.    Personal Factors and Comorbidities  Profession    Examination-Activity Limitations  Squat;Lift;Locomotion Level    Examination-Participation Restrictions  Laundry;Yard Work;Community Activity    Stability/Clinical Decision Making  Evolving/Moderate complexity    Clinical Decision Making  Moderate    Rehab Potential  Good    PT Frequency  2x / week    PT Duration  6 weeks    PT Treatment/Interventions  ADLs/Self Care Home Management;Cryotherapy;Electrical Stimulation;Ultrasound;Moist Heat;DME Instruction;Functional mobility training;Therapeutic activities;Therapeutic exercise;Neuromuscular re-education;Patient/family education;Manual techniques;Passive range of motion;Dry needling;Taping    PT Next Visit Plan  continue lateral shift correction, LAD, STM, further dry needling prn, light core work, stretches, progress to addressing lifting mechanics as appropriate pending progress in the next several visits. consider continuing e-stim with needling.    PT Home Exercise Plan  tennis ball release; piriformis stretch; LTR; lateral shift correction for home, pelvic tltis, SKTC, hip bridge, supine marches    Consulted and Agree with Plan of Care  Patient       Patient will benefit from skilled therapeutic intervention in order to improve the following deficits and impairments:  Abnormal gait, Increased muscle spasms, Decreased range of motion, Decreased safety awareness, Decreased strength, Pain, Postural dysfunction, Increased fascial restricitons, Decreased activity tolerance  Visit Diagnosis: Chronic bilateral low back pain without  sciatica  Muscle spasm of back  Other abnormalities of gait and mobility  Problem List There are no active problems to display for this patient.   Dessie Comaavid J Bradd Merlos  PT DPT  01/27/2019, 9:46 AM  [facility] [address] [place], [place], [address] Phone: [phone]   Fax:  [phone]  Name: Scott Robinson MRN: [id number] Date of Birth: 12/12/1965

## 2019-01-31 ENCOUNTER — Ambulatory Visit: Payer: BC Managed Care – PPO | Admitting: Physical Therapy

## 2019-01-31 ENCOUNTER — Other Ambulatory Visit: Payer: Self-pay

## 2019-01-31 DIAGNOSIS — R2689 Other abnormalities of gait and mobility: Secondary | ICD-10-CM

## 2019-01-31 DIAGNOSIS — M545 Low back pain, unspecified: Secondary | ICD-10-CM

## 2019-01-31 DIAGNOSIS — M62830 Muscle spasm of back: Secondary | ICD-10-CM

## 2019-01-31 DIAGNOSIS — G8929 Other chronic pain: Secondary | ICD-10-CM | POA: Diagnosis not present

## 2019-01-31 NOTE — Therapy (Signed)
[place], [place], [address] Phone: [phone]   Fax:  [phone]  Physical Therapy Treatment  Patient Details  Name: Scott Robinson MRN: [id number] Date of Birth: [birth year] Referring Provider (PT): Emelia Loron NP    Encounter Date: 01/31/2019  PT End of Session - 01/31/19 1505    Visit Number  7    Number of Visits  12    Date for PT Re-Evaluation  02/16/19    Authorization Type  BCBS    PT Start Time  1500    PT Stop Time  8527    PT Time Calculation (min)  44 min    Activity Tolerance  Patient tolerated treatment well    Behavior During Therapy  [facility] for tasks assessed/performed       No past medical history on file.  No past surgical history on file.  There were no vitals filed for this visit.  Subjective Assessment - 01/31/19 1504    Subjective  Patient continues to have pain in his hips and his lower back. When he sits for too long he sitffnes up. The pain continues to go into the back of the leg.    How long can you stand comfortably?  < 1 hour    How long can you walk comfortably?  limited distances with pain    Diagnostic tests  MRI: per patient showed stenosis    Patient Stated Goals  to have less pain    Currently in Pain?  Yes    Pain Score  7     Pain Location  Back    Pain Orientation  Right    Pain Descriptors / Indicators  Aching    Pain Type  Chronic pain    Pain Onset  More than a month ago    Pain Frequency  Constant    Aggravating Factors   standing and walking    Pain Relieving Factors  sitting then standing up    Effect of Pain on Daily Activities  unable to work                       [facility] Adult PT Treatment/Exercise - 01/31/19 0001      Lumbar Exercises: Stretches   Pelvic Tilt  10 reps    Piriformis Stretch  Right;Left;2 reps;30 seconds    Other Lumbar Stretch Exercise  prayer stretch 2x20 sec hold then 2x20 sec hold lateral in each  direction.       Lumbar Exercises: Supine   Clam Limitations  2x10 green with cuing for abdominal brace     Bridge Limitations  1/2 bridge 2x10     Other Supine Lumbar Exercises  supine march 2x10 with breathing       Manual Therapy   Manual Therapy  Joint mobilization    Manual therapy comments  skilled palpationof trigger points     Joint Mobilization  Garde II and III PA glides to L2, L3 , L4     Soft tissue mobilization  IASTMY to bilateral paraspinals;     Manual Traction  bilateral LAD with Grade II-III oscilations        Trigger Point Dry Needling - 01/31/19 0001    Consent Given?  Yes    Muscles Treated Back/Hip  Gluteus medius    E-stim with Dry Needling Details  3sots using 30x60 need;e     Gluteus Medius Response  Twitch response elicited  PT Education - 01/31/19 1611    Education Details  reviewed benefits and risks of TPDN    Person(s) Educated  Patient    Methods  Explanation;Demonstration;Tactile cues;Verbal cues    Comprehension  Verbalized understanding;Tactile cues required;Returned demonstration;Verbal cues required       PT Short Term Goals - 01/31/19 1615      PT SHORT TERM GOAL #1   Title  Patient will be indpendent with basic strengthening and stretching program    Baseline  working on basic stretvhing and strengthening.    Time  3    Period  Weeks    Status  On-going    Target Date  01/26/19      PT SHORT TERM GOAL #2   Title  Patient will increase gorss bilateral hip strength to 4+/5    Baseline  not tested    Time  3    Period  Weeks    Status  On-going    Target Date  01/26/19      PT SHORT TERM GOAL #3   Title  Patient will increase lumbar flexion by 20 degrees and extension by 10 degrees    Time  3    Period  Weeks    Status  On-going    Target Date  01/26/19        PT Long Term Goals - 01/05/19 0929      PT LONG TERM GOAL #1   Title  Patient will stand for 1 hour wouout pain    Time  6    Period  Weeks     Status  New    Target Date  02/16/19      PT LONG TERM GOAL #2   Title  Patient will pick 10lb obhject off the floor without pain    Time  6    Period  Weeks    Status  New    Target Date  02/16/19            Plan - 01/31/19 1506    Clinical Impression Statement  Patient continues to rpeort simpilar pain. Some days he reports feeling better but then it comes back. He had increased pain in the right leg with left leg stretching today., He has low tolerance to basic stretching and ther-ex. He reports his elbows are now hurting 2nd to UE core stability exercises. H ereports he is just doing 20 resp 2x a day. Overall he is moving better then when he started with less of a lateral shift but he has plateaued over the past 2-3 visitis. He continues to have significant spasming of bilateral paraspinals.    Personal Factors and Comorbidities  Profession    Examination-Activity Limitations  Squat;Lift;Locomotion Level    Examination-Participation Restrictions  Laundry;Yard Work;Community Activity    Stability/Clinical Decision Making  Evolving/Moderate complexity    Clinical Decision Making  Moderate    Rehab Potential  Good    PT Frequency  2x / week    PT Treatment/Interventions  ADLs/Self Care Home Management;Cryotherapy;Electrical Stimulation;Ultrasound;Moist Heat;DME Instruction;Functional mobility training;Therapeutic activities;Therapeutic exercise;Neuromuscular re-education;Patient/family education;Manual techniques;Passive range of motion;Dry needling;Taping    PT Next Visit Plan  continue lateral shift correction, LAD, STM, further dry needling prn, light core work, stretches, progress to addressing lifting mechanics as appropriate pending progress in the next several visits. consider continuing e-stim with needling.    PT Home Exercise Plan  tennis ball release; piriformis stretch; LTR; lateral shift correction for home, pelvic tltis, SKTC, hip  bridge, supine marches    Consulted and  Agree with Plan of Care  Patient       Patient will benefit from skilled therapeutic intervention in order to improve the following deficits and impairments:  Abnormal gait, Increased muscle spasms, Decreased range of motion, Decreased safety awareness, Decreased strength, Pain, Postural dysfunction, Increased fascial restricitons, Decreased activity tolerance  Visit Diagnosis: Chronic bilateral low back pain without sciatica  Muscle spasm of back  Other abnormalities of gait and mobility     Problem List There are no active problems to display for this patient.   Dessie Coma PT DPT  01/31/2019, 4:17 PM  [facility] [address] [place], [place], [address] Phone: [phone]   Fax:  [phone]  Name: Scott Robinson MRN: [id number] Date of Birth: 04/29/1965

## 2019-02-02 ENCOUNTER — Ambulatory Visit: Payer: BC Managed Care – PPO | Admitting: Physical Therapy

## 2019-02-02 ENCOUNTER — Other Ambulatory Visit: Payer: Self-pay

## 2019-02-02 DIAGNOSIS — R2689 Other abnormalities of gait and mobility: Secondary | ICD-10-CM | POA: Diagnosis not present

## 2019-02-02 DIAGNOSIS — M62830 Muscle spasm of back: Secondary | ICD-10-CM

## 2019-02-02 DIAGNOSIS — M545 Low back pain, unspecified: Secondary | ICD-10-CM

## 2019-02-02 DIAGNOSIS — G8929 Other chronic pain: Secondary | ICD-10-CM | POA: Diagnosis not present

## 2019-02-03 NOTE — Therapy (Signed)
[facility] [address] [place], [place], [address] Phone: [phone]   Fax:  [phone]  Physical Therapy Treatment  Patient Details  Name: Scott Robinson MRN: [id number] Date of Birth: 02/18/1966 Referring Provider (PT): Carmel Sacramentoerry Tyler NP    Encounter Date: 02/02/2019  PT End of Session - 02/02/19 1615    Visit Number  8    Number of Visits  12    Date for PT Re-Evaluation  02/16/19    Authorization Type  BCBS    PT Start Time  1547    PT Stop Time  1630    PT Time Calculation (min)  43 min    Activity Tolerance  Patient tolerated treatment well    Behavior During Therapy  [facility] for tasks assessed/performed       No past medical history on file.  No past surgical history on file.  There were no vitals filed for this visit.  Subjective Assessment - 02/02/19 1554    Subjective  Patient reports his back is a little better today. He feels the prayer stretch and the lateral prayer stretch have helped. The pain is still in his low back and into his buttock area.    How long can you stand comfortably?  < 1 hour    How long can you walk comfortably?  limited distances with pain    Diagnostic tests  MRI: per patient showed stenosis    Patient Stated Goals  to have less pain    Currently in Pain?  No/denies    Pain Score  6     Pain Location  Back    Pain Orientation  Right    Pain Descriptors / Indicators  Aching    Pain Type  Chronic pain    Pain Onset  More than a month ago    Pain Frequency  Constant    Aggravating Factors   standing and walking    Pain Relieving Factors  sitting then standing up    Effect of Pain on Daily Activities  unable to work                       [facility] Adult PT Treatment/Exercise - 02/03/19 0001      Lumbar Exercises: Stretches   Lower Trunk Rotation Limitations  x10     Pelvic Tilt  10 reps    Piriformis Stretch  Right;Left;2 reps;30 seconds    Other Lumbar Stretch  Exercise  prayer stretch 2x20 sec hold then 2x20 sec hold lateral in each direction.  SDHown at sink       Lumbar Exercises: Supine   Clam Limitations  2x10 green with cuing for abdominal brace     Other Supine Lumbar Exercises  supine march 2x10 with breathing     Other Supine Lumbar Exercises  ball squeeze with breathing 2x10       Manual Therapy   Manual Therapy  Joint mobilization    Manual therapy comments  skilled palpationof trigger points     Joint Mobilization  Garde II and III PA glides to L2, L3 , L4     Soft tissue mobilization  IASTMY to bilateral paraspinals;     Manual Traction  bilateral LAD with Grade II-III oscilations              PT Education - 02/03/19 1033    Education Details  reviewed benfits and risk of TPDN    Person(s) Educated  Patient  Methods  Explanation;Demonstration;Tactile cues;Verbal cues    Comprehension  Verbalized understanding;Returned demonstration;Verbal cues required;Tactile cues required       PT Short Term Goals - 01/31/19 1615      PT SHORT TERM GOAL #1   Title  Patient will be indpendent with basic strengthening and stretching program    Baseline  working on basic stretvhing and strengthening.    Time  3    Period  Weeks    Status  On-going    Target Date  01/26/19      PT SHORT TERM GOAL #2   Title  Patient will increase gorss bilateral hip strength to 4+/5    Baseline  not tested    Time  3    Period  Weeks    Status  On-going    Target Date  01/26/19      PT SHORT TERM GOAL #3   Title  Patient will increase lumbar flexion by 20 degrees and extension by 10 degrees    Time  3    Period  Weeks    Status  On-going    Target Date  01/26/19        PT Long Term Goals - 01/05/19 0929      PT LONG TERM GOAL #1   Title  Patient will stand for 1 hour wouout pain    Time  6    Period  Weeks    Status  New    Target Date  02/16/19      PT LONG TERM GOAL #2   Title  Patient will pick 10lb obhject off the floor  without pain    Time  6    Period  Weeks    Status  New    Target Date  02/16/19            Plan - 02/02/19 1616    Clinical Impression Statement  Patient is making porgress. he has less spasming in his paraspinals. he tolerated ther-ex well. He continues to report 6/10 pain but had less tenderness to palpation. Patient will continue 1-2x a week for 2 weeks until he sees the MD. he continues to have a minor lateral shift.    Personal Factors and Comorbidities  Profession    Examination-Activity Limitations  Squat;Lift;Locomotion Level    Examination-Participation Restrictions  Laundry;Yard Work;Community Activity    Rehab Potential  Good    PT Frequency  2x / week    PT Duration  6 weeks    PT Treatment/Interventions  ADLs/Self Care Home Management;Cryotherapy;Electrical Stimulation;Ultrasound;Moist Heat;DME Instruction;Functional mobility training;Therapeutic activities;Therapeutic exercise;Neuromuscular re-education;Patient/family education;Manual techniques;Passive range of motion;Dry needling;Taping    PT Next Visit Plan  continue lateral shift correction, LAD, STM, further dry needling prn, light core work, stretches, progress to addressing lifting mechanics as appropriate pending progress in the next several visits. consider continuing e-stim with needling.    PT Home Exercise Plan  tennis ball release; piriformis stretch; LTR; lateral shift correction for home, pelvic tltis, SKTC, hip bridge, supine marches    Consulted and Agree with Plan of Care  Patient       Patient will benefit from skilled therapeutic intervention in order to improve the following deficits and impairments:  Abnormal gait, Increased muscle spasms, Decreased range of motion, Decreased safety awareness, Decreased strength, Pain, Postural dysfunction, Increased fascial restricitons, Decreased activity tolerance  Visit Diagnosis: Chronic bilateral low back pain without sciatica  Muscle spasm of back  Other  abnormalities of gait and  mobility     Problem List There are no active problems to display for this patient.   Dessie Coma PT DPT  02/03/2019, 10:34 AM  [facility] [address] [place], [place], [address] Phone: [phone]   Fax:  [phone]  Name: Graham Hyun MRN: [id number] Date of Birth: July 09, 1965

## 2019-02-09 ENCOUNTER — Other Ambulatory Visit: Payer: Self-pay

## 2019-02-09 ENCOUNTER — Encounter: Payer: Self-pay | Admitting: Physical Therapy

## 2019-02-09 ENCOUNTER — Ambulatory Visit: Payer: BC Managed Care – PPO | Admitting: Physical Therapy

## 2019-02-09 DIAGNOSIS — M62830 Muscle spasm of back: Secondary | ICD-10-CM

## 2019-02-09 DIAGNOSIS — M545 Low back pain: Secondary | ICD-10-CM | POA: Diagnosis not present

## 2019-02-09 DIAGNOSIS — G8929 Other chronic pain: Secondary | ICD-10-CM | POA: Diagnosis not present

## 2019-02-09 DIAGNOSIS — R2689 Other abnormalities of gait and mobility: Secondary | ICD-10-CM | POA: Diagnosis not present

## 2019-02-09 NOTE — Therapy (Signed)
[place], [place], [address] Phone: [phone]   Fax:  [phone]  Physical Therapy Treatment  Patient Details  Name: Scott Robinson MRN: [id number] Date of Birth: 1966/03/16 Referring Provider (PT): Emelia Loron NP    Encounter Date: 02/09/2019  PT End of Session - 02/09/19 1103    Visit Number  9    Number of Visits  12    Date for PT Re-Evaluation  02/16/19    Authorization Type  BCBS    PT Start Time  1103    PT Stop Time  1147    PT Time Calculation (min)  44 min    Activity Tolerance  Patient tolerated treatment well    Behavior During Therapy  [facility] for tasks assessed/performed       History reviewed. No pertinent past medical history.  History reviewed. No pertinent surgical history.  There were no vitals filed for this visit.  Subjective Assessment - 02/09/19 1104    Subjective  Pt reports 6-7/10 pain in the low back. Lt > Rt. he does feel like DN and PT is helping. most difficulty is after prolonged sitting and then transitioning to stand.    Currently in Pain?  Yes    Pain Score  6     Pain Location  Back    Pain Orientation  Left;Right    Pain Descriptors / Indicators  Shooting;Pressure;Tightness    Pain Type  Chronic pain    Aggravating Factors   transitioinig to stand    Pain Relieving Factors  moving around                       [facility] Adult PT Treatment/Exercise - 02/09/19 0001      Self-Care   Self-Care  Other Self-Care Comments    Other Self-Care Comments   foam roller to lumbar paraspinals      Exercises   Exercises  Lumbar      Lumbar Exercises: Stretches   Other Lumbar Stretch Exercise  thoracic opener lying supine over soft bolster with head support      Lumbar Exercises: Standing   Other Standing Lumbar Exercises  5-10 reps trunk extension betweene exercise. Attempted dead lifts -pt not able to maintain good form - practiced hip hinges with  dowel on back to work on form.        Lumbar Exercises: Seated   Other Seated Lumbar Exercises  pelvic rocks FWD/BWD sitting on blue disc.       Lumbar Exercises: Prone   Other Prone Lumbar Exercises  POE with moving into baby cobra - after mobiliztions     Other Prone Lumbar Exercises  pelvic press 10x5sec, then with 10 reps bilat knee flex/extt      Manual Therapy   Manual Therapy  Joint mobilization    Joint Mobilization  grade III-IV CPA mobs sacrum to L3,multiple bouts followed by moving into trunk extension actively     Soft tissue mobilization  STM to bilat lumbar paraspinals the tightness decreased as spinal mobility improved.                PT Short Term Goals - 01/31/19 1615      PT SHORT TERM GOAL #1   Title  Patient will be indpendent with basic strengthening and stretching program    Baseline  working on basic stretvhing and strengthening.    Time  3    Period  Weeks  Status  On-going    Target Date  01/26/19      PT SHORT TERM GOAL #2   Title  Patient will increase gorss bilateral hip strength to 4+/5    Baseline  not tested    Time  3    Period  Weeks    Status  On-going    Target Date  01/26/19      PT SHORT TERM GOAL #3   Title  Patient will increase lumbar flexion by 20 degrees and extension by 10 degrees    Time  3    Period  Weeks    Status  On-going    Target Date  01/26/19        PT Long Term Goals - 01/05/19 0929      PT LONG TERM GOAL #1   Title  Patient will stand for 1 hour wouout pain    Time  6    Period  Weeks    Status  New    Target Date  02/16/19      PT LONG TERM GOAL #2   Title  Patient will pick 10lb obhject off the floor without pain    Time  6    Period  Weeks    Status  New    Target Date  02/16/19            Plan - 02/09/19 1154    Clinical Impression Statement  Pt was very hypomoblie in lower lumbar spine with mobs initially with pain, this decreased with repitition of CPA mobs.  He tolerated initial  work into trunk extension with reports of 2 pt reduction of back pain.  Attempted dead lift strengtheing - pt unable to maintain safe back posture for this.  Would benefit from continued work to progress to safe dead lift strengthening to facilite return to loading jobs and no pain in upright posture. He may also need more DN to lumbar paraspinals.    Rehab Potential  Good    PT Frequency  2x / week    PT Duration  6 weeks    PT Treatment/Interventions  ADLs/Self Care Home Management;Cryotherapy;Electrical Stimulation;Ultrasound;Moist Heat;DME Instruction;Functional mobility training;Therapeutic activities;Therapeutic exercise;Neuromuscular re-education;Patient/family education;Manual techniques;Passive range of motion;Dry needling;Taping    PT Next Visit Plan  lumbar CPA mobs, slow work into extension and strengthening.    Consulted and Agree with Plan of Care  Patient       Patient will benefit from skilled therapeutic intervention in order to improve the following deficits and impairments:  Abnormal gait, Increased muscle spasms, Decreased range of motion, Decreased safety awareness, Decreased strength, Pain, Postural dysfunction, Increased fascial restricitons, Decreased activity tolerance  Visit Diagnosis: Chronic bilateral low back pain without sciatica  Muscle spasm of back  Other abnormalities of gait and mobility     Problem List There are no active problems to display for this patient.   Roderic Scarce PT  02/09/2019, 11:58 AM  [facility] [address] [place], [place], [address] Phone: [phone]   Fax:  [phone]  Name: Scott Robinson MRN: [id number] Date of Birth: May 06, 1965

## 2019-02-15 ENCOUNTER — Ambulatory Visit: Payer: BC Managed Care – PPO | Admitting: Physical Therapy

## 2019-02-15 ENCOUNTER — Other Ambulatory Visit: Payer: Self-pay

## 2019-02-15 ENCOUNTER — Encounter: Payer: Self-pay | Admitting: Physical Therapy

## 2019-02-15 DIAGNOSIS — G8929 Other chronic pain: Secondary | ICD-10-CM | POA: Diagnosis not present

## 2019-02-15 DIAGNOSIS — R2689 Other abnormalities of gait and mobility: Secondary | ICD-10-CM | POA: Diagnosis not present

## 2019-02-15 DIAGNOSIS — M545 Low back pain: Secondary | ICD-10-CM | POA: Diagnosis not present

## 2019-02-15 DIAGNOSIS — M62830 Muscle spasm of back: Secondary | ICD-10-CM | POA: Diagnosis not present

## 2019-02-15 NOTE — Therapy (Signed)
[facility] [address] [place], [place], [address] Phone: [phone]   Fax:  [phone]  Physical Therapy Treatment/Progress Note   Patient Details  Name: Scott Robinson MRN: [id number] Date of Birth: 05/09/1965 Referring Provider (PT): Carmel Sacramento NP    Encounter Date: 02/15/2019  PT End of Session - 02/15/19 0806    Visit Number  10    Number of Visits  12    Date for PT Re-Evaluation  02/16/19    Authorization Type  BCBS    PT Start Time  0801    PT Stop Time  0849    PT Time Calculation (min)  48 min    Activity Tolerance  Patient tolerated treatment well    Behavior During Therapy  [facility] for tasks assessed/performed       History reviewed. No pertinent past medical history.  History reviewed. No pertinent surgical history.  There were no vitals filed for this visit.  Subjective Assessment - 02/15/19 0804    Subjective  Patient reports his back is about the same. He feels like the prone exercises are helping but then it goes right back. He comes in today with a significant lateral shift again. His pain is about the same.    How long can you stand comfortably?  < 1 hour    How long can you walk comfortably?  limited distances with pain    Diagnostic tests  MRI: per patient showed stenosis    Patient Stated Goals  to have less pain    Currently in Pain?  Yes    Pain Score  6     Pain Location  Back    Pain Orientation  Right;Left    Pain Descriptors / Indicators  Aching    Pain Type  Chronic pain    Pain Onset  More than a month ago    Pain Frequency  Constant    Aggravating Factors   transitioning to standing    Pain Relieving Factors  moving around    Effect of Pain on Daily Activities  unable to work    Multiple Pain Sites  No         [facility] PT Assessment - 02/15/19 0001      AROM   Lumbar Flexion  60 with tightness>pain in lumbar region      Strength   Right Hip Flexion  4/5    Right Hip  ABduction  4+/5    Right Hip ADduction  4+/5    Left Hip Flexion  4/5    Left Hip ABduction  4+/5    Left Hip ADduction  4+/5                   [facility] Adult PT Treatment/Exercise - 02/15/19 0001      Lumbar Exercises: Stretches   Lower Trunk Rotation Limitations  x10     Piriformis Stretch  Right;Left;2 reps;30 seconds      Lumbar Exercises: Aerobic   Nustep  5 min L5       Lumbar Exercises: Standing   Other Standing Lumbar Exercises  functional lifting from elevated table 3x10 10 lb mod cuing for proper technique       Lumbar Exercises: Prone   Other Prone Lumbar Exercises  POE with slow increase in hight     Other Prone Lumbar Exercises  unable to perfrom press ups       Modalities   Modalities  Electrical Stimulation  Acupuncturist Location  perfromed with dry needling 8 min       Manual Therapy   Manual Therapy  Joint mobilization    Manual therapy comments  skilled palpationof trigger points     Joint Mobilization  Garde II and III PA glides to L2, L3 , L4     Soft tissue mobilization  IASTMY to bilateral paraspinals;     Manual Traction  bilateral LAD with Grade II-III oscilations        Trigger Point Dry Needling - 02/15/19 0001    Consent Given?  Yes    Muscles Treated Back/Hip  --    Dry Needling Comments  needling in prone over pillow with 60 mm 32 gauge needles, paraspinals needled at L4-S1 region bilaterally    E-stim with Dry Needling Details  TENS 2 pps x 10 minutes           PT Education - 02/15/19 0858    Education Details  overlall progression of exercises.    Person(s) Educated  Patient    Methods  Explanation;Demonstration;Tactile cues;Verbal cues    Comprehension  Verbalized understanding;Returned demonstration;Verbal cues required;Tactile cues required       PT Short Term Goals - 02/15/19 0911      PT SHORT TERM GOAL #1   Title  Patient will be indpendent with basic strengthening and  stretching program    Baseline  working on basic stretvhing and strengthening.    Time  3    Period  Weeks    Status  Achieved    Target Date  01/26/19      PT SHORT TERM GOAL #2   Title  Patient will increase gorss bilateral hip strength to 4+/5    Baseline  --    Time  3    Period  Weeks    Status  On-going    Target Date  01/26/19      PT SHORT TERM GOAL #3   Title  Patient will increase lumbar flexion by 20 degrees and extension by 10 degrees    Time  3    Period  Weeks    Status  On-going    Target Date  01/26/19        PT Long Term Goals - 02/15/19 0919      PT LONG TERM GOAL #1   Title  Patient will stand for 1 hour wouout pain    Baseline  improved ability to stand    Status  On-going      PT LONG TERM GOAL #2   Title  Patient will pick 10lb obhject off the floor without pain    Baseline  lifted 10lb object from elvated suraface and had tightness    Time  6    Period  Weeks    Status  On-going            Plan - 02/15/19 7510    Clinical Impression Statement  The patient has made progress but he still has significant limitations. He came into today with his lateral shift back. After 5 min ofn the nu-step the shift had corrected. He continues to have significant spasming of the lumbar paraspinals. Therapy used needling with e-stim today which helped with his spasming. He is mostly having pain when he sits for a while thne stands. The pain has centralized for the most part. He has worked on prone positioning and press ups, but was unable to perfrom a press-up  today. he had increased pain down his right leg. He was advised to continue to work on prone on his elbows at home. His overall leg strength has improved but it is still limited. Therapy has begun working on Chiropractorfuntional strengthening. Today the patient worked on functional lifting rom an eloevated surface and had increased tightness in his back. He will return to MD. He has made some progress but overt the past  4-5 visits we have had a plateau in progress. Therapy will proceed per MD recocmendations.    Personal Factors and Comorbidities  Profession    Examination-Activity Limitations  Squat;Lift;Locomotion Level    Examination-Participation Restrictions  Laundry;Yard Work;Community Activity    Stability/Clinical Decision Making  Evolving/Moderate complexity    Clinical Decision Making  Moderate    Rehab Potential  Good    PT Frequency  2x / week    PT Duration  6 weeks    PT Treatment/Interventions  ADLs/Self Care Home Management;Cryotherapy;Electrical Stimulation;Ultrasound;Moist Heat;DME Instruction;Functional mobility training;Therapeutic activities;Therapeutic exercise;Neuromuscular re-education;Patient/family education;Manual techniques;Passive range of motion;Dry needling;Taping    PT Next Visit Plan  lumbar CPA mobs, slow work into extension and strengthening.    PT Home Exercise Plan  tennis ball release; piriformis stretch; LTR; lateral shift correction for home, pelvic tltis, SKTC, hip bridge, supine marches    Consulted and Agree with Plan of Care  Patient       Patient will benefit from skilled therapeutic intervention in order to improve the following deficits and impairments:  Abnormal gait, Increased muscle spasms, Decreased range of motion, Decreased safety awareness, Decreased strength, Pain, Postural dysfunction, Increased fascial restricitons, Decreased activity tolerance  Visit Diagnosis: Chronic bilateral low back pain without sciatica  Muscle spasm of back  Other abnormalities of gait and mobility     Problem List There are no active problems to display for this patient.   Dessie Comaavid J Emilee Market PT DPT  02/15/2019, 9:24 AM  [facility] [address] [place], [place], [address] Phone: [phone]   Fax:  [phone]  Name: Jerral BonitoSomkhuan Mensing MRN: [id number] Date of Birth: 08/07/1965

## 2019-02-18 DIAGNOSIS — M5416 Radiculopathy, lumbar region: Secondary | ICD-10-CM | POA: Diagnosis not present

## 2019-02-18 DIAGNOSIS — Z79899 Other long term (current) drug therapy: Secondary | ICD-10-CM | POA: Diagnosis not present

## 2019-02-18 DIAGNOSIS — M48062 Spinal stenosis, lumbar region with neurogenic claudication: Secondary | ICD-10-CM | POA: Diagnosis not present

## 2019-03-04 ENCOUNTER — Telehealth: Payer: Self-pay | Admitting: Physical Therapy

## 2019-03-04 NOTE — Telephone Encounter (Signed)
Therapy left message with patient on 03/02/2019 regarding new prescription for physical therapy. Patient's note and prescription state for Occupational workplace assessment at [facility]. Therapy spoke with patient on 12/10 after 5:00 PM. Patient reported he was unsure what he is supposed to be doing. He states he is supposed to go to work on Monday but has not had assessment r been contacted. On 03/04/2019 therapy contacted [place] and [facility] center to see what the patient is to be doing. The patients number was left on a voicemail with each place asking to please call the patient. The patient was contacted and advised to schedule at least a visit or two with physical therapy. He had had slow improvement with physical therapy but had had some improvement from baseline. Therapy will re-assess the patient and continue therapy if we can start making forward progress again. Patient contacted and is aware.

## 2019-03-07 ENCOUNTER — Encounter: Payer: Self-pay | Admitting: Physical Therapy

## 2019-03-07 ENCOUNTER — Ambulatory Visit: Payer: BC Managed Care – PPO | Attending: Nurse Practitioner | Admitting: Physical Therapy

## 2019-03-07 ENCOUNTER — Other Ambulatory Visit: Payer: Self-pay

## 2019-03-07 DIAGNOSIS — G8929 Other chronic pain: Secondary | ICD-10-CM | POA: Insufficient documentation

## 2019-03-07 DIAGNOSIS — R2689 Other abnormalities of gait and mobility: Secondary | ICD-10-CM | POA: Insufficient documentation

## 2019-03-07 DIAGNOSIS — M545 Low back pain: Secondary | ICD-10-CM | POA: Insufficient documentation

## 2019-03-07 DIAGNOSIS — M62830 Muscle spasm of back: Secondary | ICD-10-CM | POA: Insufficient documentation

## 2019-03-07 NOTE — Therapy (Signed)
[facility] [address] [place], [place], [address] Phone: [phone]   Fax:  [phone]  Physical Therapy Treatment/Re-eval  Patient Details  Name: Scott BonitoSomkhuan Hassebrock MRN: [id number] Date of Birth: 03/12/1966 Referring Provider (PT): Carmel Sacramentoerry Tyler NP    Encounter Date: 03/07/2019  PT End of Session - 03/07/19 0808    Visit Number  11    Number of Visits  19    Date for PT Re-Evaluation  04/04/19    Authorization Type  BCBS    PT Start Time  0800    PT Stop Time  0840    PT Time Calculation (min)  40 min       History reviewed. No pertinent past medical history.  History reviewed. No pertinent surgical history.  There were no vitals filed for this visit.  Subjective Assessment - 03/07/19 0804    Subjective  The patient returns for follow up. He feels that since he has stopped therapy his pain has gotten owrse. He has been using a cane to walk. He feels like his leg is catching and hie is getting sharp pain. The prescription looks like he is supposed to have an occupational work assessment with [employer]. Therapy contacted [employer] and contacted [facility] to clarify what he is to do    How long can you stand comfortably?  < 1 hour    How long can you walk comfortably?  limited distances with pain    Diagnostic tests  MRI: per patient showed stenosis    Patient Stated Goals  to have less pain    Currently in Pain?  Yes    Pain Score  7     Pain Location  Back    Pain Orientation  Right;Left    Pain Descriptors / Indicators  Aching    Pain Type  Chronic pain    Pain Radiating Towards  into the front of the legs    Pain Onset  More than a month ago    Pain Frequency  Intermittent    Aggravating Factors   transitioning into standing, mornings    Pain Relieving Factors  moving around    Effect of Pain on Daily Activities  unable to work         [facility] PT Assessment - 03/07/19 0001      Sensation   Additional  Comments  pain radiates into the front of his thighs       Coordination   Gross Motor Movements are Fluid and Coordinated  Yes    Fine Motor Movements are Fluid and Coordinated  Yes      Posture/Postural Control   Posture Comments  continued lateral shift to the left. Shift improves with actvity       AROM   Lumbar Flexion  30 degrees with increased radiuclar symptoms     Lumbar Extension  10 degrees with increasedpain     Lumbar - Right Side Bend  stiffness noted by patient     Lumbar - Left Side Bend  equal stiffness left and right     Lumbar - Right Rotation  limited 75% with pain     Lumbar - Left Rotation  limited 75% with pain more pain on the left       Strength   Right Hip Flexion  4/5    Right Hip ABduction  4+/5    Right Hip ADduction  4/5    Left Hip Flexion  3+/5  Left Hip ABduction  4/5    Left Hip ADduction  4+/5      Palpation   Palpation comment  significat spasming of paraspinals.                    [facility] Adult PT Treatment/Exercise - 03/07/19 0001      Self-Care   Other Self-Care Comments   reviewed HEP and the improtance of corrdcting lateral shift.       Lumbar Exercises: Stretches   Lower Trunk Rotation Limitations  x10     Piriformis Stretch  Right;Left;2 reps;30 seconds    Other Lumbar Stretch Exercise  thomas stretch 2x20 sec hold bilateral with mod cuing for technique       Lumbar Exercises: Aerobic   Nustep  5 min L5       Lumbar Exercises: Supine   AB Set Limitations  reviewed abdominal breathing     Clam Limitations  2x10 green with cuing for abdominal brace     Bent Knee Raise Limitations  2x10 with cuing for abdominal bracing       Manual Therapy   Manual Therapy  Joint mobilization    Manual therapy comments  skilled palpationof trigger points     Joint Mobilization  Garde II and III PA glides to , L3 , L4 ; L5     Soft tissue mobilization  IASTMY to bilateral paraspinals;     Manual Traction  bilateral LAD with Grade  II-III oscilations              PT Education - 03/07/19 4540    Person(s) Educated  Patient    Methods  Explanation;Demonstration;Tactile cues;Verbal cues    Comprehension  Returned demonstration;Verbalized understanding;Verbal cues required;Tactile cues required       PT Short Term Goals - 03/07/19 0950      PT SHORT TERM GOAL #1   Title  Patient will be indpendent with basic strengthening and stretching program    Baseline  working on basic stretvhing and strengthening.    Time  3    Period  Weeks    Status  Achieved    Target Date  01/26/19      PT SHORT TERM GOAL #2   Title  Patient will increase gorss bilateral hip strength to 4+/5    Baseline  not tested    Time  3    Period  Weeks    Status  On-going    Target Date  01/26/19      PT SHORT TERM GOAL #3   Title  Patient will increase lumbar flexion by 20 degrees and extension by 10 degrees    Time  3    Period  Weeks    Status  On-going    Target Date  01/26/19        PT Long Term Goals - 02/15/19 0919      PT LONG TERM GOAL #1   Title  Patient will stand for 1 hour wouout pain    Baseline  improved ability to stand    Status  On-going      PT LONG TERM GOAL #2   Title  Patient will pick 10lb obhject off the floor without pain    Baseline  lifted 10lb object from elvated suraface and had tightness    Time  6    Period  Weeks    Status  On-going  Plan - 03/07/19 0865    Clinical Impression Statement  Patient's lateral shift was improved after treatment. The problem with his trewtment has alswas been carryover. He was enocurged to work on lateral shifts at home. He was also given an anterior hip stretch for home. He was encouraged to continue his stretching and strengthening program hat home. Therapy will continue to wokr with patien 2W$ but if he does not make progress with carryover in the next few visits we will likley discharge. Therapy will also continue to work on prone press-ups  as able. He continues to increased symptoms with flexion. His left hip strength has decreased since the intial eval. He also continues to have significant spasming in his lumbar parapsinals.    Personal Factors and Comorbidities  Profession    Examination-Activity Limitations  Squat;Lift;Locomotion Level    Stability/Clinical Decision Making  Evolving/Moderate complexity    Clinical Decision Making  Moderate    Rehab Potential  Good    PT Frequency  2x / week    PT Duration  6 weeks    PT Treatment/Interventions  ADLs/Self Care Home Management;Cryotherapy;Electrical Stimulation;Ultrasound;Moist Heat;DME Instruction;Functional mobility training;Therapeutic activities;Therapeutic exercise;Neuromuscular re-education;Patient/family education;Manual techniques;Passive range of motion;Dry needling;Taping    PT Next Visit Plan  lumbar CPA mobs, slow work into extension and strengthening.    PT Home Exercise Plan  tennis ball release; piriformis stretch; LTR; lateral shift correction for home, pelvic tltis, SKTC, hip bridge, supine marches    Consulted and Agree with Plan of Care  Patient       Patient will benefit from skilled therapeutic intervention in order to improve the following deficits and impairments:  Abnormal gait, Increased muscle spasms, Decreased range of motion, Decreased safety awareness, Decreased strength, Pain, Postural dysfunction, Increased fascial restricitons, Decreased activity tolerance  Visit Diagnosis: Chronic bilateral low back pain without sciatica  Muscle spasm of back  Other abnormalities of gait and mobility     Problem List There are no problems to display for this patient.   Dessie Coma PT DPT  03/07/2019, 9:54 AM  [facility] [address] [place], [place], [address] Phone: [phone]   Fax:  [phone]  Name: Hezzie Karim MRN: [id number] Date of Birth: Sep 27, 1965

## 2019-03-07 NOTE — Addendum Note (Signed)
Addended by: Carney Living on: 03/07/2019 10:02 AM   Modules accepted: Orders

## 2019-03-08 DIAGNOSIS — M545 Low back pain: Secondary | ICD-10-CM | POA: Diagnosis not present

## 2019-03-10 DIAGNOSIS — M545 Low back pain: Secondary | ICD-10-CM | POA: Diagnosis not present

## 2019-03-10 DIAGNOSIS — M4316 Spondylolisthesis, lumbar region: Secondary | ICD-10-CM | POA: Diagnosis not present

## 2019-03-15 ENCOUNTER — Ambulatory Visit: Payer: BC Managed Care – PPO | Admitting: Physical Therapy

## 2019-03-15 ENCOUNTER — Other Ambulatory Visit: Payer: Self-pay

## 2019-03-15 DIAGNOSIS — R2689 Other abnormalities of gait and mobility: Secondary | ICD-10-CM | POA: Diagnosis not present

## 2019-03-15 DIAGNOSIS — M62830 Muscle spasm of back: Secondary | ICD-10-CM | POA: Diagnosis not present

## 2019-03-15 DIAGNOSIS — G8929 Other chronic pain: Secondary | ICD-10-CM

## 2019-03-15 DIAGNOSIS — M545 Low back pain: Secondary | ICD-10-CM | POA: Diagnosis not present

## 2019-03-15 NOTE — Therapy (Addendum)
[place], [place], [address] Phone: [phone]   Fax:  [phone]  Physical Therapy Treatment/Dsicharge   Patient Details  Name: Scott Robinson MRN: [id number] Date of Birth: Aug 20, 1965 Referring Provider (PT): Emelia Loron NP    Encounter Date: 03/15/2019    No past medical history on file.  No past surgical history on file.  There were no vitals filed for this visit.  Subjective Assessment - 03/15/19 1507    Subjective  The patient has been to Dr Ronnald Ramp who plans on doing surgery on his back. Therapy will review HEP with patient    How long can you stand comfortably?  < 1 hour    How long can you walk comfortably?  limited distances with pain    Diagnostic tests  MRI: per patient showed stenosis    Patient Stated Goals  to have less pain    Currently in Pain?  Yes    Pain Score  6     Pain Location  Back    Pain Orientation  Right    Pain Descriptors / Indicators  Aching    Pain Type  Chronic pain    Pain Radiating Towards  into the front of his legs    Pain Onset  More than a month ago    Pain Frequency  Intermittent    Aggravating Factors   transitioning from sit to stand    Pain Relieving Factors  moving around    Effect of Pain on Daily Activities  unable to work                       [facility] Adult PT Treatment/Exercise - 03/15/19 0001      Self-Care   Other Self-Care Comments   reviewed how to use HEP; reviewed the improtance of staying strong and mobile prior to hospitilization       Lumbar Exercises: Stretches   Active Hamstring Stretch Limitations  seated 3x20 sec hold     Single Knee to Chest Stretch Limitations  3x20 sec hold     Lower Trunk Rotation Limitations  x10     Piriformis Stretch  Right;Left;2 reps;30 seconds      Lumbar Exercises: Supine   AB Set Limitations  reviewed abdominal breathing     Clam Limitations  2x10 green with cuing for abdominal  brace     Bent Knee Raise Limitations  2x10 with cuing for abdominal bracing     Bridge Limitations  x10 half bridge     Other Supine Lumbar Exercises  supine march 2x10       Manual Therapy   Soft tissue mobilization  IASTMY to bilateral paraspinals;     Manual Traction  bilateral LAD with Grade II-III oscilations              PT Education - 03/15/19 1623    Education Details  final HEP    Person(s) Educated  Patient    Methods  Tactile cues;Demonstration;Verbal cues;Explanation    Comprehension  Verbalized understanding;Returned demonstration;Verbal cues required;Tactile cues required       PT Short Term Goals - 03/07/19 0950      PT SHORT TERM GOAL #1   Title  Patient will be indpendent with basic strengthening and stretching program    Baseline  working on basic stretvhing and strengthening.    Time  3    Period  Weeks    Status  Achieved  Target Date  01/26/19      PT SHORT TERM GOAL #2   Title  Patient will increase gorss bilateral hip strength to 4+/5    Baseline  not tested    Time  3    Period  Weeks    Status  On-going    Target Date  01/26/19      PT SHORT TERM GOAL #3   Title  Patient will increase lumbar flexion by 20 degrees and extension by 10 degrees    Time  3    Period  Weeks    Status  On-going    Target Date  01/26/19        PT Long Term Goals - 02/15/19 0919      PT LONG TERM GOAL #1   Title  Patient will stand for 1 hour wouout pain    Baseline  improved ability to stand    Status  On-going      PT LONG TERM GOAL #2   Title  Patient will pick 10lb obhject off the floor without pain    Baseline  lifted 10lb object from elvated suraface and had tightness    Time  6    Period  Weeks    Status  On-going            Plan - 03/15/19 1633    Clinical Impression Statement  Patient was given a final HEP. He was given 4 stretches and 3 exercises to continue to work on prior to his surgery. He was advised that if his pain starts  going back up in between when he has  his surgery that he can come back in for manual therapy. He is otherwise going to continue with his HEP at home.    Personal Factors and Comorbidities  Profession    Examination-Activity Limitations  Squat;Lift;Locomotion Level    Examination-Participation Restrictions  Laundry;Yard Work;Community Activity    Stability/Clinical Decision Making  Evolving/Moderate complexity    Clinical Decision Making  Moderate    Rehab Potential  Good    PT Frequency  2x / week    PT Duration  6 weeks    PT Treatment/Interventions  ADLs/Self Care Home Management;Cryotherapy;Electrical Stimulation;Ultrasound;Moist Heat;DME Instruction;Functional mobility training;Therapeutic activities;Therapeutic exercise;Neuromuscular re-education;Patient/family education;Manual techniques;Passive range of motion;Dry needling;Taping    PT Next Visit Plan  lumbar CPA mobs, slow work into extension and strengthening.    PT Home Exercise Plan  tennis ball release; piriformis stretch; LTR; lateral shift correction for home, pelvic tltis, SKTC, hip bridge, supine marches    Consulted and Agree with Plan of Care  Patient       Patient will benefit from skilled therapeutic intervention in order to improve the following deficits and impairments:  Abnormal gait, Increased muscle spasms, Decreased range of motion, Decreased safety awareness, Decreased strength, Pain, Postural dysfunction, Increased fascial restricitons, Decreased activity tolerance  Visit Diagnosis: Chronic bilateral low back pain without sciatica  Muscle spasm of back  Other abnormalities of gait and mobility  PHYSICAL THERAPY DISCHARGE SUMMARY  Visits from Start of Care: 13  Current functional level related to goals / functional outcomes: Continued pain with activity    Remaining deficits: Pain with bending    Education / Equipment: HEP   Plan: Patient agrees to discharge.  Patient goals were not met. Patient is  being discharged due to lack of progress.  ?????       Problem List There are no problems to display for this patient.  Carney Living PT DPT 03/15/2019, 4:55 PM  [facility] [address] [place], [place], [address] Phone: [phone]   Fax:  [phone]  Name: Jance Siek MRN: [id number] Date of Birth: [birth year]

## 2019-03-21 DIAGNOSIS — M5416 Radiculopathy, lumbar region: Secondary | ICD-10-CM | POA: Diagnosis not present

## 2019-03-21 DIAGNOSIS — M48062 Spinal stenosis, lumbar region with neurogenic claudication: Secondary | ICD-10-CM | POA: Diagnosis not present

## 2019-03-21 DIAGNOSIS — Z79899 Other long term (current) drug therapy: Secondary | ICD-10-CM | POA: Diagnosis not present

## 2019-03-24 DIAGNOSIS — M4316 Spondylolisthesis, lumbar region: Secondary | ICD-10-CM | POA: Diagnosis not present

## 2019-03-25 HISTORY — PX: BACK SURGERY: SHX140

## 2019-04-05 ENCOUNTER — Encounter: Payer: Self-pay | Admitting: Physical Therapy

## 2019-10-25 ENCOUNTER — Other Ambulatory Visit: Payer: Self-pay | Admitting: Neurological Surgery

## 2019-10-25 DIAGNOSIS — M545 Low back pain, unspecified: Secondary | ICD-10-CM

## 2019-11-03 ENCOUNTER — Ambulatory Visit
Admission: RE | Admit: 2019-11-03 | Discharge: 2019-11-03 | Disposition: A | Discharge status: Home / Self Care | Payer: BC Managed Care – PPO | Source: Ambulatory Visit | Attending: Neurological Surgery | Admitting: Neurological Surgery

## 2019-11-03 ENCOUNTER — Other Ambulatory Visit: Payer: Self-pay

## 2019-11-03 DIAGNOSIS — M545 Low back pain, unspecified: Secondary | ICD-10-CM

## 2020-02-13 ENCOUNTER — Other Ambulatory Visit: Payer: Self-pay | Admitting: Neurological Surgery

## 2020-02-13 DIAGNOSIS — M545 Low back pain, unspecified: Secondary | ICD-10-CM

## 2020-02-14 ENCOUNTER — Telehealth: Payer: Self-pay

## 2020-02-14 NOTE — Telephone Encounter (Signed)
Patient returned my call for [place] to screen his medications before getting him scheduled for a lumbar myelogram.  He stated an understanding that he will be here two hours, will need a driver and will need to be on strict bedrest (explained) for 24 hours.  He also stated an understanding that he needs to hold Duloxetine for 48 hours before, and 24 hours after, the myelogram.

## 2020-02-24 NOTE — Discharge Instructions (Signed)
Myelogram Discharge Instructions  1. Go home and rest quietly for the next 24 hours.  It is important to lie flat for the next 24 hours.  Get up only to go to the restroom.  You may lie in the bed or on a couch on your back, your stomach, your left side or your right side.  You may have one pillow under your head.  You may have pillows between your knees while you are on your side or under your knees while you are on your back.  2. DO NOT drive today.  Recline the seat as far back as it will go, while still wearing your seat belt, on the way home.  3. You may get up to go to the bathroom as needed.  You may sit up for 10 minutes to eat.  You may resume your normal diet and medications unless otherwise indicated.  Drink lots of extra fluids today and tomorrow.  4. The incidence of headache, nausea, or vomiting is about 5% (one in 20 patients).  If you develop a headache, lie flat and drink plenty of fluids until the headache goes away.  Caffeinated beverages may be helpful.  If you develop severe nausea and vomiting or a headache that does not go away with flat bed rest, call [phone].  5. You may resume normal activities after your 24 hours of bed rest is over; however, do not exert yourself strongly or do any heavy lifting tomorrow. If when you get up you have a headache when standing, go back to bed and force fluids for another 24 hours.  6. Call your physician for a follow-up appointment.  The results of your myelogram will be sent directly to your physician by the following day.  7. If you have any questions or if complications develop after you arrive home, please call [phone].  Discharge instructions have been explained to the patient.  The patient, or the person responsible for the patient, fully understands these instructions  YOU MAY RESUME YOUR CYMBALTA 02/28/20 AT 1030 AM

## 2020-02-27 ENCOUNTER — Ambulatory Visit
Admission: RE | Admit: 2020-02-27 | Discharge: 2020-02-27 | Disposition: A | Discharge status: Home / Self Care | Payer: 59 | Source: Ambulatory Visit | Attending: Neurological Surgery | Admitting: Neurological Surgery

## 2020-02-27 ENCOUNTER — Other Ambulatory Visit: Payer: Self-pay

## 2020-02-27 DIAGNOSIS — M545 Low back pain, unspecified: Secondary | ICD-10-CM

## 2020-02-27 MED ORDER — OXYCODONE-ACETAMINOPHEN 5-325 MG PO TABS
1.0000 | ORAL_TABLET | Freq: Once | ORAL | Status: AC
  Administered 2020-02-27: 1 via ORAL

## 2020-02-27 MED ORDER — DIAZEPAM 5 MG PO TABS
10.0000 mg | ORAL_TABLET | Freq: Once | ORAL | Status: AC
  Administered 2020-02-27: 10 mg via ORAL

## 2020-02-27 MED ORDER — IOPAMIDOL (ISOVUE-M 200) INJECTION 41%
18.0000 mL | Freq: Once | INTRAMUSCULAR | Status: AC
  Administered 2020-02-27: 18 mL via INTRATHECAL

## 2020-02-27 NOTE — Progress Notes (Signed)
Pt reports he has been off of his Cymbalta for at least 48 hours.  

## 2020-10-17 ENCOUNTER — Ambulatory Visit: Payer: 59

## 2020-10-19 ENCOUNTER — Ambulatory Visit: Payer: 59 | Attending: Neurosurgery

## 2020-10-19 ENCOUNTER — Encounter (INDEPENDENT_AMBULATORY_CARE_PROVIDER_SITE_OTHER): Payer: Self-pay

## 2020-10-19 ENCOUNTER — Other Ambulatory Visit: Payer: Self-pay

## 2020-10-19 DIAGNOSIS — G8929 Other chronic pain: Secondary | ICD-10-CM | POA: Diagnosis present

## 2020-10-19 DIAGNOSIS — M545 Low back pain, unspecified: Secondary | ICD-10-CM | POA: Insufficient documentation

## 2020-10-19 DIAGNOSIS — R2689 Other abnormalities of gait and mobility: Secondary | ICD-10-CM | POA: Diagnosis present

## 2020-10-19 DIAGNOSIS — M62830 Muscle spasm of back: Secondary | ICD-10-CM | POA: Diagnosis not present

## 2020-10-19 NOTE — Therapy (Signed)
[facility] [place] [facility] [address] [place], [place], [address] Phone: [phone]   Fax:  [phone]  Physical Therapy Evaluation  Patient Details  Name: Scott Robinson MRN: [id number] Date of Birth: October 15, 1965 Referring Provider (PT): Rulon Abide NP   Encounter Date: 10/19/2020   PT End of Session - 10/19/20 0842     Visit Number 1    Date for PT Re-Evaluation 11/30/20    Authorization Type [employer]    PT Start Time 0757    PT Stop Time 0830    PT Time Calculation (min) 33 min    Activity Tolerance Patient tolerated treatment well;Patient limited by pain    Behavior During Therapy [facility] for tasks assessed/performed             History reviewed. No pertinent past medical history.  History reviewed. No pertinent surgical history.  There were no vitals filed for this visit.    Subjective Assessment - 10/19/20 0759     Subjective In January of 2021 pt underwent back surgery of lumbosacral spine. Had some exercise prior to surgery in 2020. Today is 2nd time for PT. Has had 2-3 injections for pain management of low back. Reports pain got better after surgery but is still there with back pain into both legs. Everytime he bends forward or sits for a while he gets alot of low back pain and burning/N/T into legs that worsens at night as well. Getting up to walk for short periods helps relieve temporarily but if for to long increases pain again.    Currently in Pain? Yes    Pain Score 6     Pain Location Back    Pain Orientation Lower    Pain Descriptors / Indicators Sore;Tightness    Pain Type Chronic pain    Pain Radiating Towards BLE occasionally down toward feet.    Pain Onset More than a month ago    Pain Frequency Constant   sometimes less but never gone   Pain Relieving Factors gabapentin, helps a little bit.                [facility] PT Assessment - 10/19/20 0001       Assessment   Medical Diagnosis h/o  lumbosacral spine surgery/HEP    Referring Provider (PT) Rulon Abide NP    Hand Dominance Right      Home Environment   Additional Comments small steps into home. 1 level. lives with partner and famiy.      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 Rotation full on the right with less pain, limited to 50% on the left and painful.      Strength   Overall Strength Comments B Hip 3+/5 limited by pain right more than left. B knees grossly 4/5 with increased back pain      Flexibility   Soft Tissue Assessment /  Muscle Length --   v tight B HS , piriformis     Palpation   Palpation comment decr mms extensibility lumbar paraspinals, gluts and b piriformis      Transfers   Five time sit to stand comments  19.5 seconds, some anterior thigh pain/tightness, UE support, very guarded.      Ambulation/Gait   Gait Comments stiff, antalgic, slow.                        Objective measurements completed on examination: See above findings.               PT Education - 10/19/20 2440     Education Details PT POC and initial HEP. Access Code: [email or web address]. Safe use of heat pre/post stretching to help relax mms    Person(s) Educated Patient    Methods Explanation;Demonstration;Handout    Comprehension Verbalized understanding;Returned demonstration;Need further instruction              PT Short Term Goals - 10/19/20 0841       PT SHORT TERM GOAL #1    Title Patient will be indpendent with initial HEP    Time 2    Period Weeks    Status New    Target Date 11/02/20               PT Long Term Goals - 10/19/20 0842       PT LONG TERM GOAL #1   Title Independent with advanced HEP for flexibility and strength    Time 6    Period Weeks    Status New    Target Date 11/30/20      PT LONG TERM GOAL #2   Title Patient will pick 10lb object off the floor without pain and minimal tightness    Time 6    Period Weeks    Status New    Target Date 11/30/20      PT LONG TERM GOAL #3   Title FOTO improved to at least 45%    Time 6    Period Weeks    Status New    Target Date 11/30/20      PT LONG TERM GOAL #4   Title 5TSTS improved to </= 10 seocnds to demonstrate improved functional strength with </= 2/10 pain    Time 6    Period Weeks    Status New    Target Date 11/30/20      PT LONG TERM GOAL #5   Title Tightness decreased to mild limitations for BLE to faciltiate functional mobility    Time 6    Period Weeks    Status New    Target Date 11/30/20                    Plan - 10/19/20 0835     Clinical Impression Statement Pt is a 55 yo male with history of chronic LBP and lumbosacral surgery January 2020, with continued LBP with symptoms into BLE since that time (back pain bilateral but more on the right than left). He currently presents with decreased thoracolumbar ROM, diminished core and BLE strength, back mms guarding, and significantly tight LE mms bilaterally. As a result of painful ROM and decreased strength he has difficulty with sitting, bending, walking for long periods. 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 He will benefit from skilled PT services to address  the aformentioned impairments, work towards decreasing pain and improving functioanl mobility.    Personal Factors and Comorbidities Time since onset of injury/illness/exacerbation    Examination-Activity  Limitations Bend;Sit;Locomotion Level;Squat;Stand;Lift    Examination-Participation Restrictions Occupation;Cleaning;Driving    Stability/Clinical Decision Making Evolving/Moderate complexity    Clinical Decision Making Moderate    Rehab Potential Good    PT Frequency 2x / week    PT Duration 6 weeks    PT Treatment/Interventions ADLs/Self Care Home Management;Cryotherapy;Electrical Stimulation;Iontophoresis 4mg /ml Dexamethasone;Moist Heat;Gait training;Neuromuscular re-education;Balance training;Therapeutic exercise;Therapeutic activities;Functional mobility training;Patient/family education;Manual techniques    PT Next Visit Plan Reassess HEP. Gentle progression of LE flexibility and core stabilization, LE strength as tolerated. Manual and modalities as needed. Work towards improving functional movements with less pain    PT Home Exercise Plan see pt instructions    Consulted and Agree with Plan of Care Patient             Patient will benefit from skilled therapeutic intervention in order to improve the following deficits and impairments:  Difficulty walking, Decreased range of motion, Increased muscle spasms, Pain, Improper body mechanics, Impaired flexibility, Hypomobility, Decreased mobility, Decreased strength  Visit Diagnosis: Muscle spasm of back  Other abnormalities of gait and mobility  Chronic low back pain, unspecified back pain laterality, unspecified whether sciatica present     Problem List Patient Active Problem List   Diagnosis Date Noted   Spinal stenosis of lumbar region 10/11/2018   Degeneration of lumbar intervertebral disc 10/20/2017    10/22/2017 Santos Hardwick 10/19/2020, 8:46 AM  [facility] [address] [place], [place], [place] Phone: [phone]   Fax:  [phone]  Name: Jorgen Wolfinger MRN: Jerral Bonito Date of Birth: 1966-01-07

## 2020-10-19 NOTE — Patient Instructions (Signed)
Access Code: [email or web address] URL: [email or web address] Date: 10/19/2020 Prepared by: Claude Manges  Exercises Seated Piriformis Stretch - 1 x daily - 7 x weekly - 4 sets - 20-30 seconds hold Supine Double Knee to Chest - 1 x daily - 7 x weekly - 5 sets - 10-20 seconds hold Supine Hamstring Stretch with Strap - 1 x daily - 7 x weekly - 5 sets - 10-20 seconds hold Heat - 1 x daily - 7 x weekly - 10-20 minutes hold

## 2020-10-26 ENCOUNTER — Ambulatory Visit: Payer: 59 | Attending: Neurosurgery | Admitting: Physical Therapy

## 2020-10-26 ENCOUNTER — Other Ambulatory Visit: Payer: Self-pay

## 2020-10-26 DIAGNOSIS — M62830 Muscle spasm of back: Secondary | ICD-10-CM | POA: Insufficient documentation

## 2020-10-26 DIAGNOSIS — G8929 Other chronic pain: Secondary | ICD-10-CM | POA: Diagnosis present

## 2020-10-26 DIAGNOSIS — M545 Low back pain, unspecified: Secondary | ICD-10-CM | POA: Diagnosis present

## 2020-10-26 NOTE — Therapy (Signed)
[place] [place], [place], [address] Phone: [phone]   Fax:  [phone]  Physical Therapy Treatment  Patient Details  Name: Scott Robinson MRN: [id number] Date of Birth: Jun 27, 1965 Referring Provider (PT): Leonie Green NP   Encounter Date: 10/26/2020   PT End of Session - 10/26/20 0917     Visit Number 2    Date for PT Re-Evaluation 11/30/20    Authorization Type [employer]    PT Start Time 0845    PT Stop Time 0930    PT Time Calculation (min) 45 min             No past medical history on file.  No past surgical history on file.  There were no vitals filed for this visit.   Subjective Assessment - 10/26/20 0846     Subjective doing HEP and some muscle soreness    Currently in Pain? Yes    Pain Score 6     Pain Location Back                               [facility] Adult PT Treatment/Exercise - 10/26/20 0001       Exercises   Exercises Lumbar;Knee/Hip      Lumbar Exercises: Aerobic   UBE (Upper Arm Bike) L 2 2 min fwd/2 min back ward      Lumbar Exercises: Machines for Strengthening   Cybex Lumbar Extension black tband 2 sets 10    Other Lumbar Machine Exercise 20# lat pull and row 20# 2 sets 10      Lumbar Exercises: Supine   Ab Set 15 reps;3 seconds    Pelvic Tilt 15 reps   3 sec   Clam 15 reps   green tband   Bent Knee Raise 20 reps   green tband   Bridge with [employer] 10 reps;Compliant    Other Supine Lumbar Exercises bridge feet on ball,obl and KTC 10 x      Knee/Hip Exercises: Aerobic   Nustep L 4 5 min      Knee/Hip Exercises: Machines for Strengthening   Cybex Knee Extension 5# 2 sets 10   tried 10 # but after 2 reps decreased d/t too heavy per pt   Cybex Knee Flexion 20# 2 sets 10      Manual Therapy   Manual Therapy Passive ROM    Manual therapy comments HS are very tire and pt guards d/t pain Left > RT    Passive ROM LE and trunk                       PT Short Term Goals - 10/26/20 0917       PT SHORT TERM GOAL #1   Title Patient will be indpendent with initial HEP    Status Achieved               PT Long Term Goals - 10/19/20 5188       PT LONG TERM GOAL #1   Title Independent with advanced HEP for flexibility and strength    Time 6    Period Weeks    Status New    Target Date 11/30/20      PT LONG TERM GOAL #2   Title Patient will pick 10lb object off the floor without pain and minimal tightness    Time 6  Period Weeks    Status New    Target Date 11/30/20      PT LONG TERM GOAL #3   Title FOTO improved to at least 45%    Time 6    Period Weeks    Status New    Target Date 11/30/20      PT LONG TERM GOAL #4   Title 5TSTS improved to </= 10 seocnds to demonstrate improved functional strength with </= 2/10 pain    Time 6    Period Weeks    Status New    Target Date 11/30/20      PT LONG TERM GOAL #5   Title Tightness decreased to mild limitations for BLE to faciltiate functional mobility    Time 6    Period Weeks    Status New    Target Date 11/30/20                   Plan - 10/26/20 0917     Clinical Impression Statement STG met as pt verb doing inital HEP. Pt tolerated initail ther ex fair, pt needed postural cuing and cues to engage core with ex. Pt is very tight in LE esp HS Left > RT ,stressed importance of strteching at home.    PT Treatment/Interventions ADLs/Self Care Home Management;Cryotherapy;Electrical Stimulation;Iontophoresis 62m/ml Dexamethasone;Moist Heat;Gait training;Neuromuscular re-education;Balance training;Therapeutic exercise;Therapeutic activities;Functional mobility training;Patient/family education;Manual techniques    PT Next Visit Plan LE flexiblity . core and LE strength. Postural Educ             Patient will benefit from skilled therapeutic intervention in order to improve the following deficits and impairments:  Difficulty  walking, Decreased range of motion, Increased muscle spasms, Pain, Improper body mechanics, Impaired flexibility, Hypomobility, Decreased mobility, Decreased strength  Visit Diagnosis: Muscle spasm of back  Chronic low back pain, unspecified back pain laterality, unspecified whether sciatica present     Problem List Patient Active Problem List   Diagnosis Date Noted   Spinal stenosis of lumbar region 10/11/2018   Degeneration of lumbar intervertebral disc 10/20/2017    Annete Ayuso,[name] PTA 10/26/2020, 9:20 AM  [place] [address]: [phone]  Fax:  [phone] Name: Scott SheerinMRN: [id number] of Birth: 1Apr 24, 1967

## 2020-10-30 ENCOUNTER — Other Ambulatory Visit: Payer: Self-pay

## 2020-10-30 ENCOUNTER — Ambulatory Visit: Payer: 59 | Admitting: Physical Therapy

## 2020-10-30 DIAGNOSIS — M62830 Muscle spasm of back: Secondary | ICD-10-CM

## 2020-10-30 DIAGNOSIS — G8929 Other chronic pain: Secondary | ICD-10-CM

## 2020-10-30 DIAGNOSIS — M545 Low back pain, unspecified: Secondary | ICD-10-CM

## 2020-10-30 NOTE — Therapy (Signed)
[facility] [place] [facility] [address] [place], [place], [address] Phone: [phone]   Fax:  [phone]  Physical Therapy Treatment  Patient Details  Name: Jude Naclerio MRN: [id number] Date of Birth: 09-27-1965 Referring Provider (PT): Rulon Abide NP   Encounter Date: 10/30/2020   PT End of Session - 10/30/20 0829     Visit Number 3    Date for PT Re-Evaluation 11/30/20    Authorization Type [employer]    PT Start Time 0800    PT Stop Time 0845    PT Time Calculation (min) 45 min             No past medical history on file.  No past surgical history on file.  There were no vitals filed for this visit.   Subjective Assessment - 10/30/20 0801     Subjective rt leg worse/tight, sore a couple days after session but better now    Currently in Pain? Yes    Pain Score 6                                [facility] Adult PT Treatment/Exercise - 10/30/20 0001       Lumbar Exercises: Aerobic   UBE (Upper Arm Bike) L 3 2 min fwd/2 min back ward   cued for upright posture     Lumbar Exercises: Machines for Strengthening   Cybex Lumbar Extension black tband 2 sets 10    Other Lumbar Machine Exercise 20# lat pull and row 20# 2 sets 10      Lumbar Exercises: Standing   Other Standing Lumbar Exercises red tband hip ext and abd 10x BIL    Other Standing Lumbar Exercises 4# mod dead lift 2 sets 10      Lumbar Exercises: Supine   Basic Lumbar Stabilization Compliant;Non-compliant   15 min     Knee/Hip Exercises: Aerobic   Nustep L 4 5 min      Manual Therapy   Manual Therapy Passive ROM    Manual therapy comments very tight and difficulty relaxing with strteching    Passive ROM LE and trunk                      PT Short Term Goals - 10/26/20 0917       PT SHORT TERM GOAL #1   Title Patient will be indpendent with initial HEP    Status Achieved               PT Long Term Goals -  10/19/20 4917       PT LONG TERM GOAL #1   Title Independent with advanced HEP for flexibility and strength    Time 6    Period Weeks    Status New    Target Date 11/30/20      PT LONG TERM GOAL #2   Title Patient will pick 10lb object off the floor without pain and minimal tightness    Time 6    Period Weeks    Status New    Target Date 11/30/20      PT LONG TERM GOAL #3   Title FOTO improved to at least 45%    Time 6    Period Weeks    Status New    Target Date 11/30/20      PT LONG TERM GOAL #4   Title 5TSTS improved  to </= 10 seocnds to demonstrate improved functional strength with </= 2/10 pain    Time 6    Period Weeks    Status New    Target Date 11/30/20      PT LONG TERM GOAL #5   Title Tightness decreased to mild limitations for BLE to faciltiate functional mobility    Time 6    Period Weeks    Status New    Target Date 11/30/20                   Plan - 10/30/20 0829     Clinical Impression Statement pt very tight in LE and trunk,difficulty relaxing with PROM and stretching today-stressed imp of stretching multi times a day. postural cuing needed with ther ex as well as cued to engage core and relax accessory muscles.    PT Treatment/Interventions ADLs/Self Care Home Management;Cryotherapy;Electrical Stimulation;Iontophoresis 4mg /ml Dexamethasone;Moist Heat;Gait training;Neuromuscular re-education;Balance training;Therapeutic exercise;Therapeutic activities;Functional mobility training;Patient/family education;Manual techniques    PT Next Visit Plan LE flexiblity . core and LE strength. Postural Educ             Patient will benefit from skilled therapeutic intervention in order to improve the following deficits and impairments:  Difficulty walking, Decreased range of motion, Increased muscle spasms, Pain, Improper body mechanics, Impaired flexibility, Hypomobility, Decreased mobility, Decreased strength  Visit Diagnosis: Muscle spasm of  back  Chronic low back pain, unspecified back pain laterality, unspecified whether sciatica present     Problem List Patient Active Problem List   Diagnosis Date Noted   Spinal stenosis of lumbar region 10/11/2018   Degeneration of lumbar intervertebral disc 10/20/2017    Carlo Guevarra,[name] PTA 10/30/2020, 8:33 AM  [facility] [address] [place], [place], [place] Phone: [phone]   Fax:  [phone]  Name: Alick Lecomte MRN: Jerral Bonito Date of Birth: February 08, 1966

## 2020-11-01 ENCOUNTER — Other Ambulatory Visit: Payer: Self-pay

## 2020-11-01 ENCOUNTER — Ambulatory Visit: Payer: 59 | Admitting: Physical Therapy

## 2020-11-01 DIAGNOSIS — M62830 Muscle spasm of back: Secondary | ICD-10-CM

## 2020-11-01 DIAGNOSIS — G8929 Other chronic pain: Secondary | ICD-10-CM

## 2020-11-01 NOTE — Therapy (Signed)
[facility] [address] [place], [place], [address] Phone: [phone]   Fax:  [phone]  Physical Therapy Treatment  Patient Details  Name: Scott Robinson MRN: [id number] Date of Birth: 01/08/1966 Referring Provider (PT): Rulon Abide NP   Encounter Date: 11/01/2020   PT End of Session - 11/01/20 0832     Visit Number 4    Date for PT Re-Evaluation 11/30/20    Authorization Type [employer]    PT Start Time 0755    PT Stop Time 0842    PT Time Calculation (min) 47 min             No past medical history on file.  No past surgical history on file.  There were no vitals filed for this visit.   Subjective Assessment - 11/01/20 0804     Subjective burning and tingling at time, but moving better during day    Currently in Pain? Yes    Pain Score 4     Pain Location Back                               [facility] Adult PT Treatment/Exercise - 11/01/20 0001       Lumbar Exercises: Stretches   Active Hamstring Stretch 3 reps;Right;Left;20 seconds    Active Hamstring Stretch Limitations standing active      Lumbar Exercises: Aerobic   Nustep L 6      Lumbar Exercises: Machines for Strengthening   Cybex Lumbar Extension black tband 2 sets 10    Other Lumbar Machine Exercise 25# lat pull and row 25# 2 sets 10      Lumbar Exercises: Standing   Other Standing Lumbar Exercises red tband hip ext and abd 15x BIL    Other Standing Lumbar Exercises 5# mod dead lift 2 sets 10   very limited ROM d/t HS tightness     Lumbar Exercises: Seated   Sit to Stand 20 reps   10x wt ball chest press, 10x OH press     Modalities   Modalities Moist Heat;Traction      Moist Heat Therapy   Number Minutes Moist Heat 15 Minutes    Moist Heat Location Lumbar Spine      Traction   Type of Traction Lumbar    Max (lbs) 60    Time 15                      PT Short Term Goals - 10/26/20 0917        PT SHORT TERM GOAL #1   Title Patient will be indpendent with initial HEP    Status Achieved               PT Long Term Goals - 11/01/20 6761       PT LONG TERM GOAL #1   Title Independent with advanced HEP for flexibility and strength    Status On-going      PT LONG TERM GOAL #2   Title Patient will pick 10lb object off the floor without pain and minimal tightness    Status On-going      PT LONG TERM GOAL #4   Title 5TSTS improved to </= 10 seocnds to demonstrate improved functional strength with </= 2/10 pain    Status On-going      PT LONG TERM GOAL #5   Title Tightness decreased  to mild limitations for BLE to faciltiate functional mobility    Status On-going                   Plan - 11/01/20 3151     Clinical Impression Statement pt moving alittle better, less stiff but continues to need cuing with ex for posture and HS continue to be very tight. had pt do active HS stretching and stressed again need to do muti tmes daily. pt with c/o NT and burning esp at night so trial of mech traction today to see if helps.    PT Treatment/Interventions ADLs/Self Care Home Management;Cryotherapy;Electrical Stimulation;Iontophoresis 4mg /ml Dexamethasone;Moist Heat;Gait training;Neuromuscular re-education;Balance training;Therapeutic exercise;Therapeutic activities;Functional mobility training;Patient/family education;Manual techniques    PT Next Visit Plan LE flexiblity . core and LE strength. Postural Educ- ASSESS use of traction             Patient will benefit from skilled therapeutic intervention in order to improve the following deficits and impairments:  Difficulty walking, Decreased range of motion, Increased muscle spasms, Pain, Improper body mechanics, Impaired flexibility, Hypomobility, Decreased mobility, Decreased strength  Visit Diagnosis: Chronic low back pain, unspecified back pain laterality, unspecified whether sciatica present  Muscle spasm of  back     Problem List Patient Active Problem List   Diagnosis Date Noted   Spinal stenosis of lumbar region 10/11/2018   Degeneration of lumbar intervertebral disc 10/20/2017    Scott Robinson,[name] PTA 11/01/2020, 8:35 AM  [facility] [address] [place], [place], [place] Phone: [phone]   Fax:  [phone]  Name: Scott Robinson MRN: Jerral Bonito Date of Birth: 1965-12-07

## 2020-11-06 ENCOUNTER — Ambulatory Visit: Payer: 59 | Admitting: Physical Therapy

## 2020-11-06 ENCOUNTER — Other Ambulatory Visit: Payer: Self-pay

## 2020-11-06 DIAGNOSIS — M545 Low back pain, unspecified: Secondary | ICD-10-CM

## 2020-11-06 DIAGNOSIS — G8929 Other chronic pain: Secondary | ICD-10-CM

## 2020-11-06 DIAGNOSIS — M62830 Muscle spasm of back: Secondary | ICD-10-CM

## 2020-11-06 NOTE — Therapy (Signed)
[facility] [address] [place], [place], [address] Phone: [phone]   Fax:  [phone]  Physical Therapy Treatment  Patient Details  Name: Scott Robinson MRN: [id number] Date of Birth: Jan 20, 1966 Referring Provider (PT): Rulon Abide NP   Encounter Date: 11/06/2020   PT End of Session - 11/06/20 0828     Visit Number 5    Date for PT Re-Evaluation 11/30/20    Authorization Type [employer]    PT Start Time 0800    PT Stop Time 0842    PT Time Calculation (min) 42 min             No past medical history on file.  No past surgical history on file.  There were no vitals filed for this visit.   Subjective Assessment - 11/06/20 0803     Subjective more pain after traction. pain with walking    Currently in Pain? Yes    Pain Score 7     Pain Location Back                               [facility] Adult PT Treatment/Exercise - 11/06/20 0001       Lumbar Exercises: Aerobic   Nustep L 6      Lumbar Exercises: Machines for Strengthening   Cybex Lumbar Extension black tband 2 sets 10   trunk flex and ext   Other Lumbar Machine Exercise 25# lat pull and row 25# 2 sets 10      Lumbar Exercises: Supine   Basic Lumbar Stabilization Compliant;Non-compliant   15 mn     Knee/Hip Exercises: Machines for Strengthening   Cybex Knee Extension 5# 2 sets 10    Cybex Knee Flexion 20# 2 sets 10      Manual Therapy   Manual Therapy Passive ROM    Manual therapy comments very tight and c/o pain, states stretching at homeand it helps temporarily but then tightens back up quickly    Passive ROM LE and trunk                      PT Short Term Goals - 10/26/20 0917       PT SHORT TERM GOAL #1   Title Patient will be indpendent with initial HEP    Status Achieved               PT Long Term Goals - 11/01/20 8676       PT LONG TERM GOAL #1   Title Independent with advanced  HEP for flexibility and strength    Status On-going      PT LONG TERM GOAL #2   Title Patient will pick 10lb object off the floor without pain and minimal tightness    Status On-going      PT LONG TERM GOAL #4   Title 5TSTS improved to </= 10 seocnds to demonstrate improved functional strength with </= 2/10 pain    Status On-going      PT LONG TERM GOAL #5   Title Tightness decreased to mild limitations for BLE to faciltiate functional mobility    Status On-going                   Plan - 11/06/20 0829     Clinical Impression Statement pt verb pain worse after tarction. pt states has tried TENS with NE. pt  verb stetching at home with only temporary relief- "tighten right back up". pt continues to be very tight in LE and trunk however some improvement noted since [facility]. Focus session on PROM/stretching with ex to strengthen core adn LE with cues to activae core with all ex. D foloow up early Sept.    PT Treatment/Interventions ADLs/Self Care Home Management;Cryotherapy;Electrical Stimulation;Iontophoresis 4mg /ml Dexamethasone;Moist Heat;Gait training;Neuromuscular re-education;Balance training;Therapeutic exercise;Therapeutic activities;Functional mobility training;Patient/family education;Manual techniques    PT Next Visit Plan LE flexibiity. LE and core strength             Patient will benefit from skilled therapeutic intervention in order to improve the following deficits and impairments:  Difficulty walking, Decreased range of motion, Increased muscle spasms, Pain, Improper body mechanics, Impaired flexibility, Hypomobility, Decreased mobility, Decreased strength  Visit Diagnosis: Muscle spasm of back  Chronic low back pain, unspecified back pain laterality, unspecified whether sciatica present     Problem List Patient Active Problem List   Diagnosis Date Noted   Spinal stenosis of lumbar region 10/11/2018   Degeneration of lumbar intervertebral disc 10/20/2017     Scott Robinson,[name] PTA 11/06/2020, 8:37 AM  [facility] [address] [place], [place], [place] Phone: [phone]   Fax:  [phone]  Name: Scott Robinson MRN: Jerral Bonito Date of Birth: [birth year]

## 2020-11-08 ENCOUNTER — Ambulatory Visit: Payer: 59 | Admitting: Physical Therapy

## 2020-11-08 ENCOUNTER — Other Ambulatory Visit: Payer: Self-pay

## 2020-11-08 DIAGNOSIS — M62830 Muscle spasm of back: Secondary | ICD-10-CM

## 2020-11-08 DIAGNOSIS — M545 Low back pain, unspecified: Secondary | ICD-10-CM

## 2020-11-08 DIAGNOSIS — G8929 Other chronic pain: Secondary | ICD-10-CM

## 2020-11-08 NOTE — Therapy (Signed)
[place]. [place], [place], [address] Phone: [phone]   Fax:  [phone]  Physical Therapy Treatment  Patient Details  Name: Scott Robinson MRN: [id number] Date of Birth: 01-Aug-1965 Referring Provider (PT): Leonie Green NP   Encounter Date: 11/08/2020   PT End of Session - 11/08/20 0837     Visit Number 6    Date for PT Re-Evaluation 11/30/20    Authorization Type [employer]    PT Start Time 0800    PT Stop Time 0850    PT Time Calculation (min) 50 min             No past medical history on file.  No past surgical history on file.  There were no vitals filed for this visit.   Subjective Assessment - 11/08/20 0805     Subjective some better but still tight and pain. burning, NT at night    Currently in Pain? Yes    Pain Score 6     Pain Location Back                               [facility] Adult PT Treatment/Exercise - 11/08/20 0001       Lumbar Exercises: Aerobic   Nustep L 6 56mn      Lumbar Exercises: Machines for Strengthening   Cybex Lumbar Extension black tband 2 sets 15    Other Lumbar Machine Exercise 25# lat pull and row 25# 2 sets 10   postural cuing needed     Lumbar Exercises: Supine   Ab Set 15 reps;3 seconds    Bridge Non-compliant;15 reps;3 seconds    Bridge Limitations plus KTC and obl      Knee/Hip Exercises: Machines for Strengthening   Cybex Leg Press 30# 3 sets 10 ( tried 40# too heavy)   calf riases 30# 3 sets 10     Knee/Hip Exercises: Standing   Other Standing Knee Exercises hip 4 way on cable pulleys 10 each way BIL 10#      Manual Therapy   Manual Therapy Passive ROM    Passive ROM LE and trunk                      PT Short Term Goals - 10/26/20 0917       PT SHORT TERM GOAL #1   Title Patient will be indpendent with initial HEP    Status Achieved               PT Long Term Goals - 11/08/20 0834        PT LONG TERM GOAL #1   Title Independent with advanced HEP for flexibility and strength    Status On-going      PT LONG TERM GOAL #2   Title Patient will pick 10lb object off the floor without pain and minimal tightness    Status Partially Met      PT LONG TERM GOAL #3   Title FOTO improved to at least 45%    Status On-going      PT LONG TERM GOAL #4   Title 5TSTS improved to </= 10 seocnds to demonstrate improved functional strength with </= 2/10 pain    Baseline can do but pian 5/10    Status Partially Met      PT LONG TERM GOAL #5   Title Tightness decreased to mild  limitations for BLE to faciltiate functional mobility    Status Partially Met                   Plan - 11/08/20 0843     Clinical Impression Statement pt arrives today with noted improved mvmt and less stiff gait.pt states maybe some better. does c/o burning and NT at night. HS tightness and hip flexor tightness for PROM. pt continues to show weakness with ex and needs postural cuing.    PT Treatment/Interventions ADLs/Self Care Home Management;Cryotherapy;Electrical Stimulation;Iontophoresis 74m/ml Dexamethasone;Moist Heat;Gait training;Neuromuscular re-education;Balance training;Therapeutic exercise;Therapeutic activities;Functional mobility training;Patient/family education;Manual techniques             Patient will benefit from skilled therapeutic intervention in order to improve the following deficits and impairments:  Difficulty walking, Decreased range of motion, Increased muscle spasms, Pain, Improper body mechanics, Impaired flexibility, Hypomobility, Decreased mobility, Decreased strength  Visit Diagnosis: Muscle spasm of back  Chronic low back pain, unspecified back pain laterality, unspecified whether sciatica present     Problem List Patient Active Problem List   Diagnosis Date Noted   Spinal stenosis of lumbar region 10/11/2018   Degeneration of lumbar intervertebral disc  10/20/2017    Dewarren Ledbetter,[name] PTA 11/08/2020, 8:51 AM  [place] [address]: [phone]  Fax:  [phone] Name: STarrence EnckMRN: [id number] of Birth: 1Jun 11, 1967

## 2020-11-13 ENCOUNTER — Ambulatory Visit: Payer: 59 | Admitting: Physical Therapy

## 2020-11-13 ENCOUNTER — Other Ambulatory Visit: Payer: Self-pay

## 2020-11-16 ENCOUNTER — Ambulatory Visit: Payer: 59

## 2021-04-24 ENCOUNTER — Encounter: Payer: Self-pay | Admitting: Physical Therapy

## 2021-04-24 NOTE — Therapy (Signed)
[place]. [place], [place], [address] Phone: [phone]   Fax:  [phone]  Patient Details  Name: Scott Robinson MRN: [id number] Date of Birth: August 29, 1965 Referring Provider:  No ref. provider found  Encounter Date: 04/24/2021  PHYSICAL THERAPY DISCHARGE SUMMARY  Visits from Start of Care: 6  Current functional level related to goals / functional outcomes: Did not return since last session August 2023; FOTO DC complete 04/24/21    Remaining deficits: Unable to assess    Education / Equipment: Unable to assess    Patient agrees to discharge. Patient goals were partially met. Patient is being discharged due to not returning since the last visit.  Ann Lions PT, DPT, PN2   Supplemental Physical Therapist [place]. [place], [place], [address] Phone: [phone]   Fax:  [phone]

## 2022-02-26 IMAGING — CT CT L SPINE W/ CM
1 of 7 series · 6 of 14 positions shown, 8 images · IV contrast (omnipaque)
Comparison: CT 11/03/2019 and previous

CLINICAL DATA: Previous lumbar fusion. Low back and bilateral lower
extremity pain.

EXAM:
LUMBAR MYELOGRAM
CT LUMBAR SPINE WITH INTRATHECAL CONTRAST
FLUOROSCOPY TIME:  2 minutes 4 seconds; 441  u0ym1 DAP
TECHNIQUE: The procedure, risks (including but not limited to bleeding,
infection, organ damage ), benefits, and alternatives were explained
to the patient. Questions regarding the procedure were encouraged
and answered. The patient understands and consents to the procedure.
An appropriate entry site was determined under fluoroscopy. Operator
donned sterile gloves and mask. Skin site was marked, prepped with
Betadine, and draped in usual sterile fashion, and infiltrated
locally with 1% lidocaine. A 22 gauge spinal needle was advanced
into the thecal sac at L2 from a right parasagittal approach. Clear
colorless CSF returned. 17 ml Omnipaque 180 were administered
intrathecally for lumbar myelography, followed by axial CT scanning
of the lumbar spine. I personally performed the lumbar puncture and
administered the intrathecal contrast. I also personally supervised
acquisition of the myelogram images. Coronal and sagittal
reconstructions were generated from the axial scan.

[Series 3: l spine soft · axial · 0.36mm/px · z∈[+486,+666]mm · 6 of 86 slices shown, 8 images]
[im 13/86  soft-tissue]
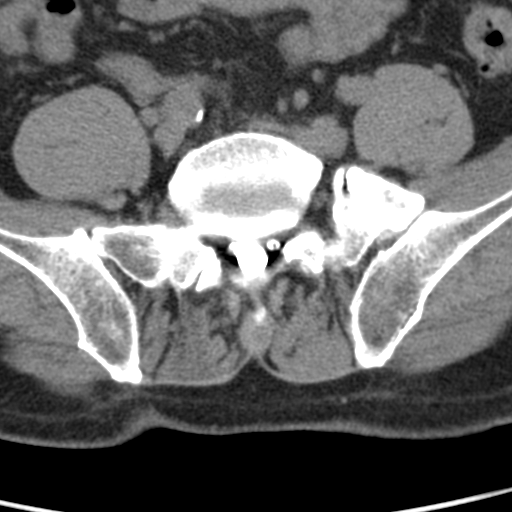
[im 13/86  bone]
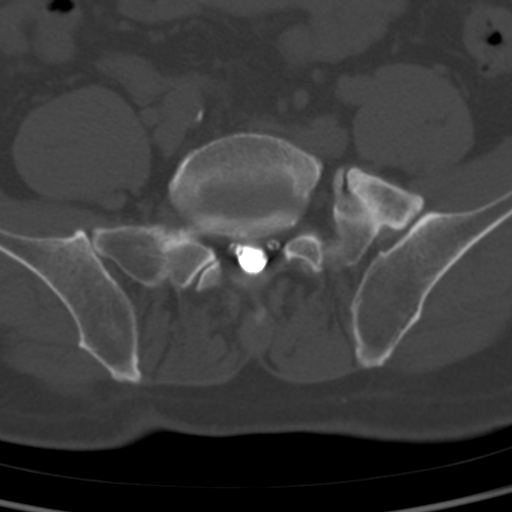
[im 25/86  bone]
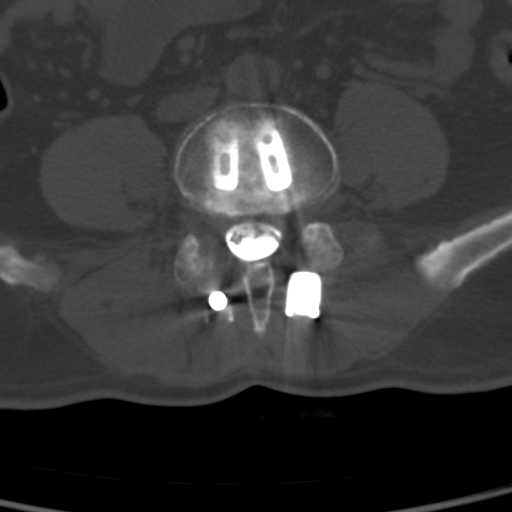
[im 37/86  bone]
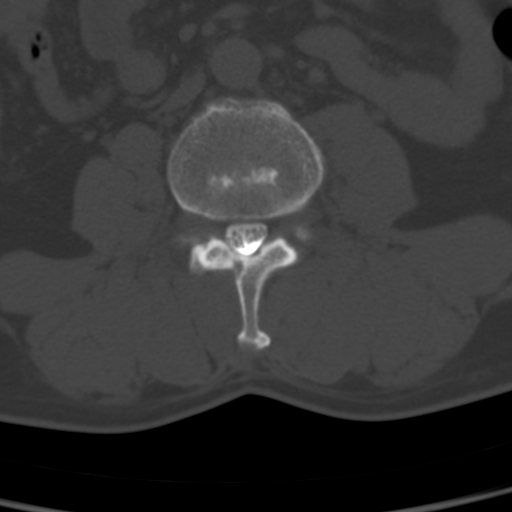
[im 49/86  bone]
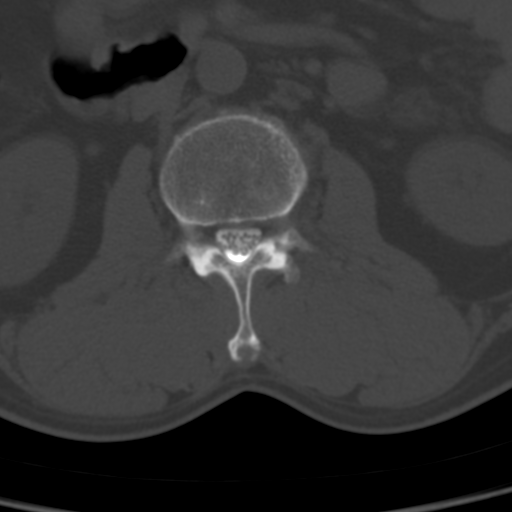
[im 61/86  soft-tissue]
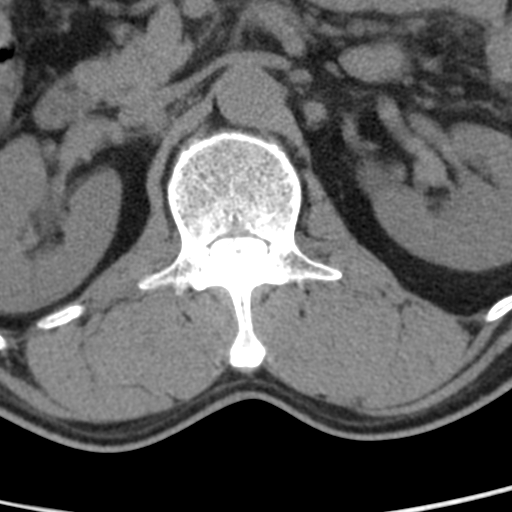
[im 61/86  bone]
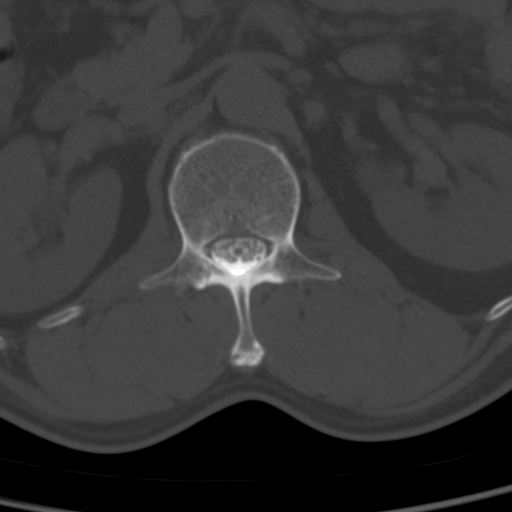
[im 73/86  bone]
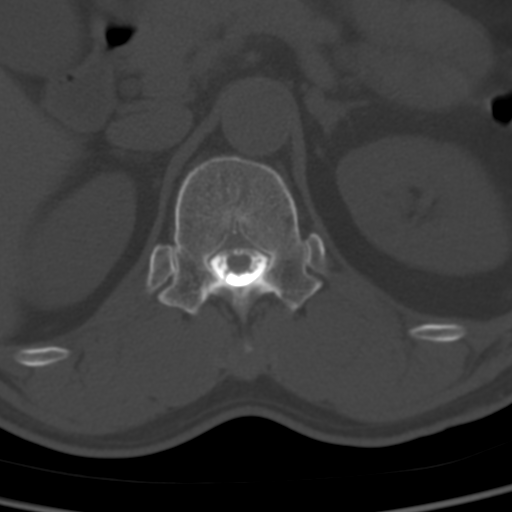

[6 of 14 positions shown; findings below may reference images not displayed]

FINDINGS: Transitional lumbosacral segment assigned L5 as before. Normal
alignment. Negative for fracture.

T11-12: Unremarkable

T12-L1: Minimal anterior endplate spurring. No posterior disc
protrusion or herniation. Central canal and foramina patent. Conus
terminates behind L1.

L1-2: Circumferential disc bulge. Mild narrowing of the AP diameter
of the thecal sac. Foramina patent.

L2-3: Circumferential disc bulge. Previous right laminotomy.
Asymmetric facet DJD left greater than right. Mild central canal
stenosis. Bilateral foraminal encroachment left worse than right.

L3-4: Circumferential disc bulge. Previous right laminotomy.
Asymmetric facet DJD right greater than left. Mild central canal
stenosis. Bilateral foraminal encroachment right worse than left.

L4-5: Previous posterolateral fusion with bilateral pedicle screws
at each level well position without surrounding lucency, intact
vertical connecting rods, graft cages in the interspace with
solid-appearing bony fusion. Central canal and foramina patent.

L5-S1: Minimal posterior disc bulge. No spinal or foraminal
stenosis. L5 is a transitional segment with DJD in the left
assimilation joint.

Mild scattered aortoiliac calcified plaque without aneurysm.
Remainder visualized paraspinal soft tissues unremarkable.
IMPRESSION: 1. Disc bulge L1-2 with mild stenosis.
2. Mild disc bulge L2-3 with mild central canal stenosis, bilateral
foraminal encroachment left worse than right.
3. Mild disc bulge L3-4 with mild central canal stenosis, bilateral
foraminal encroachment right worse than left.
4. Solid-appearing PLIF L4-5 without apparent complication.

Aortic Atherosclerosis ([id number]).

## 2022-12-04 ENCOUNTER — Encounter: Payer: Self-pay | Admitting: Internal Medicine

## 2022-12-24 ENCOUNTER — Ambulatory Visit (AMBULATORY_SURGERY_CENTER): Payer: Medicare Other

## 2022-12-24 VITALS — Ht 68.0 in | Wt 168.0 lb

## 2022-12-24 DIAGNOSIS — Z1211 Encounter for screening for malignant neoplasm of colon: Secondary | ICD-10-CM

## 2022-12-24 NOTE — Progress Notes (Signed)

## 2022-12-30 ENCOUNTER — Encounter: Payer: Self-pay | Admitting: Internal Medicine

## 2023-01-13 ENCOUNTER — Ambulatory Visit (AMBULATORY_SURGERY_CENTER): Payer: Medicare Other | Admitting: Internal Medicine

## 2023-01-13 ENCOUNTER — Encounter: Payer: Self-pay | Admitting: Internal Medicine

## 2023-01-13 VITALS — BP 118/79 | HR 70 | Temp 97.3°F | Resp 12 | Ht 68.0 in | Wt 168.0 lb

## 2023-01-13 DIAGNOSIS — Z1211 Encounter for screening for malignant neoplasm of colon: Secondary | ICD-10-CM | POA: Diagnosis not present

## 2023-01-13 MED ORDER — SODIUM CHLORIDE 0.9 % IV SOLN: 500.0000 mL | Freq: Once | INTRAVENOUS | Status: DC

## 2023-01-13 NOTE — Progress Notes (Signed)
[facility] History and Physical   Primary Care Physician:  Tisovec, Adelfa Koh, MD   Reason for Procedure:    Encounter Diagnosis  Name Primary?   Special screening for malignant neoplasms, colon Yes     Plan:    colonoscopy     HPI: Scott Robinson is a 57 y.o. male for screening exam   Past Medical History:  Diagnosis Date   Diabetes mellitus without complication (HCC)    Hyperlipidemia     Past Surgical History:  Procedure Laterality Date   BACK SURGERY  2021    Prior to Admission medications   Medication Sig Start Date End Date Taking? Authorizing Provider  metFORMIN (GLUCOPHAGE) 500 MG tablet Take 500 mg by mouth daily.   Yes [provider]  DULoxetine (CYMBALTA) 30 MG capsule Take 30 mg by mouth daily. 10/26/19   [provider]  gabapentin (NEURONTIN) 300 MG capsule Take 300 mg by mouth 3 (three) times daily as needed. 09/07/19   [provider]  ranitidine (ZANTAC) 150 MG capsule Take 150 mg by mouth 2 (two) times daily. Patient not taking: Reported on 12/24/2022    [provider]  rosuvastatin (CRESTOR) 10 MG tablet Take 10 mg by mouth daily.    [provider]    Current Outpatient Medications  Medication Sig Dispense Refill   metFORMIN (GLUCOPHAGE) 500 MG tablet Take 500 mg by mouth daily.     DULoxetine (CYMBALTA) 30 MG capsule Take 30 mg by mouth daily.     gabapentin (NEURONTIN) 300 MG capsule Take 300 mg by mouth 3 (three) times daily as needed.     ranitidine (ZANTAC) 150 MG capsule Take 150 mg by mouth 2 (two) times daily. (Patient not taking: Reported on 12/24/2022)     rosuvastatin (CRESTOR) 10 MG tablet Take 10 mg by mouth daily.     Current Facility-Administered Medications  Medication Dose Route Frequency Provider Last Rate Last Admin   0.9 %  sodium chloride infusion  500 mL Intravenous Once Iva Boop, MD        Allergies as of 01/13/2023   (No Known Allergies)    Family  History  Problem Relation Age of Onset   Colon cancer Neg Hx    Rectal cancer Neg Hx    Stomach cancer Neg Hx     Social History   Socioeconomic History   Marital status: Married    Spouse name: Not on file   Number of children: Not on file   Years of education: Not on file   Highest education level: Not on file  Occupational History   Not on file  Tobacco Use   Smoking status: Never   Smokeless tobacco: Never  Vaping Use   Vaping status: Never Used  Substance and Sexual Activity   Alcohol use: Yes    Comment: socially   Drug use: Not Currently   Sexual activity: Not on file  Other Topics Concern   Not on file  Social History Narrative   Not on file   Social Determinants of Health   Financial Resource Strain: Not on file  Food Insecurity: Not on file  Transportation Needs: Not on file  Physical Activity: Not on file  Stress: Not on file  Social Connections: Not on file  Intimate Partner Violence: Not on file    Review of Systems:  All other review of systems negative except as mentioned in the HPI.  Physical Exam: Vital signs BP 127/79  Pulse 90   Temp (!) 97.3 F (36.3 C)   Resp 13   Ht 5\' 8"  (1.727 m)   Wt 168 lb (76.2 kg)   SpO2 97%   BMI 25.54 kg/m   General:   Alert,  Well-developed, well-nourished, pleasant and cooperative in NAD Lungs:  Clear throughout to auscultation.   Heart:  Regular rate and rhythm; no murmurs, clicks, rubs,  or gallops. Abdomen:  Soft, nontender and nondistended. Normal bowel sounds.   Neuro/Psych:  Alert and cooperative. Normal mood and affect. A and O x 3   @Scott Robinson  Sena Slate, MD, [facility] [phone] (pager) 01/13/2023 10:48 AM@

## 2023-01-13 NOTE — Progress Notes (Signed)
Pt's states no medical or surgical changes since previsit or office visit. 

## 2023-01-13 NOTE — Progress Notes (Signed)
Uneventful anesthetic. Report to pacu rn. Vss. Care resumed by rn. 

## 2023-01-13 NOTE — Op Note (Signed)
[facility] Patient Name: Scott Robinson Procedure Date: 01/13/2023 10:42 AM MRN: [id number] Endoscopist: Iva Boop , MD, [id number] Age: 57 Referring MD:  Date of Birth: [birth year] Gender: Male Account #: [id number] Procedure:                Colonoscopy Indications:              Screening for colorectal malignant neoplasm, This                            is the patient's first colonoscopy Medicines:                Monitored Anesthesia Care Procedure:                Pre-Anesthesia Assessment:                           - Prior to the procedure, a History and Physical                            was performed, and patient medications and                            allergies were reviewed. The patient's tolerance of                            previous anesthesia was also reviewed. The risks                            and benefits of the procedure and the sedation                            options and risks were discussed with the patient.                            All questions were answered, and informed consent                            was obtained. Prior Anticoagulants: The patient has                            taken no anticoagulant or antiplatelet agents. ASA                            Grade Assessment: II - A patient with mild systemic                            disease. After reviewing the risks and benefits,                            the patient was deemed in satisfactory condition to                            undergo the procedure.  After obtaining informed consent, the colonoscope                            was passed under direct vision. Throughout the                            procedure, the patient's blood pressure, pulse, and                            oxygen saturations were monitored continuously. The                            Olympus CF-HQ190L ([id number]) Colonoscope was                            introduced through the  anus and advanced to the the                            cecum, identified by appendiceal orifice and                            ileocecal valve. The colonoscopy was performed                            without difficulty. The patient tolerated the                            procedure well. The quality of the bowel                            preparation was good. The ileocecal valve,                            appendiceal orifice, and rectum were photographed.                            The bowel preparation used was Miralax via split                            dose instruction. Scope In: 11:01:18 AM Scope Out: 11:13:38 AM Scope Withdrawal Time: 0 hours 9 minutes 41 seconds  Total Procedure Duration: 0 hours 12 minutes 20 seconds  Findings:                 The perianal and digital rectal examinations were                            normal. Pertinent negatives include normal prostate                            (size, shape, and consistency).                           A few diverticula were found in the sigmoid colon.  Internal hemorrhoids were found.                           The exam was otherwise without abnormality on                            direct and retroflexion views. Complications:            No immediate complications. Estimated Blood Loss:     Estimated blood loss: none. Impression:               - Diverticulosis in the sigmoid colon.                           - Internal hemorrhoids.                           - The examination was otherwise normal on direct                            and retroflexion views.                           - No specimens collected. Recommendation:           - Patient has a contact number available for                            emergencies. The signs and symptoms of potential                            delayed complications were discussed with the                            patient. Return to normal activities tomorrow.                             Written discharge instructions were provided to the                            patient.                           - Resume previous diet.                           - Continue present medications.                           - Repeat colonoscopy in 10 years for screening                            purposes. Iva Boop, MD 01/13/2023 11:20:37 AM This report has been signed electronically.

## 2023-01-13 NOTE — Patient Instructions (Addendum)
Please read handouts provided. Continue present medications. Repeat colonoscopy in 10 years for screening.   YOU HAD AN ENDOSCOPIC PROCEDURE TODAY AT THE  ENDOSCOPY CENTER:   Refer to the procedure report that was given to you for any specific questions about what was found during the examination.  If the procedure report does not answer your questions, please call your gastroenterologist to clarify.  If you requested that your care partner not be given the details of your procedure findings, then the procedure report has been included in a sealed envelope for you to review at your convenience later.  YOU SHOULD EXPECT: Some feelings of bloating in the abdomen. Passage of more gas than usual.  Walking can help get rid of the air that was put into your GI tract during the procedure and reduce the bloating. If you had a lower endoscopy (such as a colonoscopy or flexible sigmoidoscopy) you may notice spotting of blood in your stool or on the toilet paper. If you underwent a bowel prep for your procedure, you may not have a normal bowel movement for a few days.  Please Note:  You might notice some irritation and congestion in your nose or some drainage.  This is from the oxygen used during your procedure.  There is no need for concern and it should clear up in a day or so.  SYMPTOMS TO REPORT IMMEDIATELY:  Following lower endoscopy (colonoscopy or flexible sigmoidoscopy):  Excessive amounts of blood in the stool  Significant tenderness or worsening of abdominal pains  Swelling of the abdomen that is new, acute  Fever of 100F or higher   For urgent or emergent issues, a gastroenterologist can be reached at any hour by calling [phone]. Do not use MyChart messaging for urgent concerns.    DIET:  We do recommend a small meal at first, but then you may proceed to your regular diet.  Drink plenty of fluids but you should avoid alcoholic beverages for 24 hours.  ACTIVITY:  You should  plan to take it easy for the rest of today and you should NOT DRIVE or use heavy machinery until tomorrow (because of the sedation medicines used during the test).    FOLLOW UP: Our staff will call the number listed on your records the next business day following your procedure.  We will call around 7:15- 8:00 am to check on you and address any questions or concerns that you may have regarding the information given to you following your procedure. If we do not reach you, we will leave a message.     If any biopsies were taken you will be contacted by phone or by letter within the next 1-3 weeks.  Please call us at [phone] if you have not heard about the biopsies in 3 weeks.    SIGNATURES/CONFIDENTIALITY: You and/or your care partner have signed paperwork which will be entered into your electronic medical record.  These signatures attest to the fact that that the information above on your After Visit Summary has been reviewed and is understood.  Full responsibility of the confidentiality of this discharge information lies with you and/or your care-partner.No polyps or cancer were seen.  You do have diverticulosis - thickened muscle rings and pouches in the colon wall. Please read the handout about this condition.  Your hemorrhoids were a little swollen and that is common after a colonoscopy prep.  Next routine colonoscopy or other screening test in 10 years - 2034.  I appreciate  the opportunity to care for you. Iva Boop, MD, [facility]

## 2023-01-13 NOTE — Addendum Note (Signed)
Addended by: Elmarie Mainland on: 01/13/2023 11:52 AM   Modules accepted: Orders

## 2023-01-14 ENCOUNTER — Telehealth: Payer: Self-pay

## 2023-01-14 NOTE — Telephone Encounter (Signed)
No answer, left message to call if having any issues or concerns, B.Lj Miyamoto RN 

## 2023-10-09 ENCOUNTER — Encounter: Payer: Self-pay | Admitting: Advanced Practice Midwife
